# Patient Record
Sex: Female | Born: 1960
Health system: Southern US, Community
[De-identification: ages and names within clinical notes are randomized; demographics above are authoritative.]

## PROBLEM LIST (undated history)

## (undated) DIAGNOSIS — R51 Headache: Secondary | ICD-10-CM

## (undated) DIAGNOSIS — J189 Pneumonia, unspecified organism: Secondary | ICD-10-CM

## (undated) DIAGNOSIS — Z87442 Personal history of urinary calculi: Secondary | ICD-10-CM

## (undated) DIAGNOSIS — Z9889 Other specified postprocedural states: Secondary | ICD-10-CM

## (undated) DIAGNOSIS — F329 Major depressive disorder, single episode, unspecified: Secondary | ICD-10-CM

## (undated) DIAGNOSIS — R112 Nausea with vomiting, unspecified: Secondary | ICD-10-CM

## (undated) DIAGNOSIS — M1990 Unspecified osteoarthritis, unspecified site: Secondary | ICD-10-CM

## (undated) DIAGNOSIS — G709 Myoneural disorder, unspecified: Secondary | ICD-10-CM

## (undated) DIAGNOSIS — E611 Iron deficiency: Secondary | ICD-10-CM

## (undated) DIAGNOSIS — L900 Lichen sclerosus et atrophicus: Secondary | ICD-10-CM

## (undated) DIAGNOSIS — F419 Anxiety disorder, unspecified: Secondary | ICD-10-CM

## (undated) DIAGNOSIS — F32A Depression, unspecified: Secondary | ICD-10-CM

## (undated) DIAGNOSIS — R519 Headache, unspecified: Secondary | ICD-10-CM

## (undated) HISTORY — PX: BREAST SURGERY: SHX581

## (undated) HISTORY — PX: SHOULDER SURGERY: SHX246

## (undated) HISTORY — DX: Lichen sclerosus et atrophicus: L90.0

---

## 1986-09-20 HISTORY — PX: OTHER SURGICAL HISTORY: SHX169

## 1997-09-20 HISTORY — PX: TOTAL ABDOMINAL HYSTERECTOMY: SHX209

## 1999-05-12 ENCOUNTER — Ambulatory Visit (HOSPITAL_COMMUNITY): Admission: RE | Admit: 1999-05-12 | Discharge: 1999-05-12 | Payer: Self-pay | Admitting: Otolaryngology

## 1999-05-12 ENCOUNTER — Encounter: Payer: Self-pay | Admitting: Otolaryngology

## 1999-07-10 ENCOUNTER — Other Ambulatory Visit: Admission: RE | Admit: 1999-07-10 | Discharge: 1999-07-10 | Payer: Self-pay | Admitting: Obstetrics and Gynecology

## 1999-09-02 ENCOUNTER — Encounter (INDEPENDENT_AMBULATORY_CARE_PROVIDER_SITE_OTHER): Payer: Self-pay | Admitting: Specialist

## 1999-09-02 ENCOUNTER — Other Ambulatory Visit: Admission: RE | Admit: 1999-09-02 | Discharge: 1999-09-02 | Payer: Self-pay | Admitting: Obstetrics and Gynecology

## 2000-05-26 ENCOUNTER — Encounter (INDEPENDENT_AMBULATORY_CARE_PROVIDER_SITE_OTHER): Payer: Self-pay | Admitting: Specialist

## 2000-05-26 ENCOUNTER — Inpatient Hospital Stay (HOSPITAL_COMMUNITY): Admission: RE | Admit: 2000-05-26 | Discharge: 2000-05-28 | Payer: Self-pay | Admitting: Obstetrics and Gynecology

## 2001-06-22 ENCOUNTER — Other Ambulatory Visit: Admission: RE | Admit: 2001-06-22 | Discharge: 2001-06-22 | Payer: Self-pay | Admitting: Obstetrics and Gynecology

## 2001-08-16 ENCOUNTER — Ambulatory Visit (HOSPITAL_BASED_OUTPATIENT_CLINIC_OR_DEPARTMENT_OTHER): Admission: RE | Admit: 2001-08-16 | Discharge: 2001-08-16 | Payer: Self-pay | Admitting: Orthopedic Surgery

## 2001-10-31 ENCOUNTER — Encounter: Payer: Self-pay | Admitting: General Surgery

## 2001-10-31 ENCOUNTER — Encounter: Admission: RE | Admit: 2001-10-31 | Discharge: 2001-10-31 | Payer: Self-pay | Admitting: General Surgery

## 2001-12-27 ENCOUNTER — Ambulatory Visit (HOSPITAL_COMMUNITY): Admission: RE | Admit: 2001-12-27 | Discharge: 2001-12-27 | Payer: Self-pay | Admitting: Orthopedic Surgery

## 2001-12-27 ENCOUNTER — Encounter: Payer: Self-pay | Admitting: Orthopedic Surgery

## 2002-08-14 ENCOUNTER — Ambulatory Visit (HOSPITAL_BASED_OUTPATIENT_CLINIC_OR_DEPARTMENT_OTHER): Admission: RE | Admit: 2002-08-14 | Discharge: 2002-08-14 | Payer: Self-pay | Admitting: Orthopedic Surgery

## 2002-08-29 ENCOUNTER — Other Ambulatory Visit: Admission: RE | Admit: 2002-08-29 | Discharge: 2002-08-29 | Payer: Self-pay | Admitting: Obstetrics and Gynecology

## 2002-10-17 ENCOUNTER — Encounter: Payer: Self-pay | Admitting: Obstetrics and Gynecology

## 2002-10-17 ENCOUNTER — Encounter: Admission: RE | Admit: 2002-10-17 | Discharge: 2002-10-17 | Payer: Self-pay | Admitting: Obstetrics and Gynecology

## 2002-11-21 ENCOUNTER — Encounter: Payer: Self-pay | Admitting: Family Medicine

## 2002-11-21 ENCOUNTER — Encounter: Admission: RE | Admit: 2002-11-21 | Discharge: 2002-11-21 | Payer: Self-pay | Admitting: Family Medicine

## 2003-02-10 ENCOUNTER — Emergency Department (HOSPITAL_COMMUNITY): Admission: EM | Admit: 2003-02-10 | Discharge: 2003-02-10 | Payer: Self-pay | Admitting: Emergency Medicine

## 2003-04-21 ENCOUNTER — Encounter: Payer: Self-pay | Admitting: Emergency Medicine

## 2003-04-21 ENCOUNTER — Emergency Department (HOSPITAL_COMMUNITY): Admission: EM | Admit: 2003-04-21 | Discharge: 2003-04-21 | Payer: Self-pay | Admitting: Emergency Medicine

## 2003-10-24 ENCOUNTER — Encounter: Admission: RE | Admit: 2003-10-24 | Discharge: 2003-10-24 | Payer: Self-pay | Admitting: Obstetrics and Gynecology

## 2003-10-30 ENCOUNTER — Other Ambulatory Visit: Admission: RE | Admit: 2003-10-30 | Discharge: 2003-10-30 | Payer: Self-pay | Admitting: Obstetrics and Gynecology

## 2004-03-19 ENCOUNTER — Ambulatory Visit (HOSPITAL_COMMUNITY): Admission: RE | Admit: 2004-03-19 | Discharge: 2004-03-19 | Payer: Self-pay | Admitting: Orthopedic Surgery

## 2005-02-18 ENCOUNTER — Encounter: Admission: RE | Admit: 2005-02-18 | Discharge: 2005-02-18 | Payer: Self-pay | Admitting: Obstetrics and Gynecology

## 2005-06-15 ENCOUNTER — Other Ambulatory Visit: Admission: RE | Admit: 2005-06-15 | Discharge: 2005-06-15 | Payer: Self-pay | Admitting: Obstetrics and Gynecology

## 2005-09-10 ENCOUNTER — Ambulatory Visit (HOSPITAL_COMMUNITY): Admission: RE | Admit: 2005-09-10 | Discharge: 2005-09-10 | Payer: Self-pay | Admitting: Orthopedic Surgery

## 2005-09-10 ENCOUNTER — Ambulatory Visit (HOSPITAL_BASED_OUTPATIENT_CLINIC_OR_DEPARTMENT_OTHER): Admission: RE | Admit: 2005-09-10 | Discharge: 2005-09-10 | Payer: Self-pay | Admitting: Orthopedic Surgery

## 2005-12-22 ENCOUNTER — Encounter: Admission: RE | Admit: 2005-12-22 | Discharge: 2005-12-22 | Payer: Self-pay | Admitting: Obstetrics and Gynecology

## 2006-03-25 ENCOUNTER — Ambulatory Visit (HOSPITAL_COMMUNITY): Admission: RE | Admit: 2006-03-25 | Discharge: 2006-03-25 | Payer: Self-pay | Admitting: Oral Surgery

## 2006-12-27 ENCOUNTER — Encounter: Admission: RE | Admit: 2006-12-27 | Discharge: 2006-12-27 | Payer: Self-pay | Admitting: Obstetrics and Gynecology

## 2007-01-08 ENCOUNTER — Emergency Department (HOSPITAL_COMMUNITY): Admission: EM | Admit: 2007-01-08 | Discharge: 2007-01-08 | Payer: Self-pay | Admitting: Emergency Medicine

## 2007-03-22 ENCOUNTER — Ambulatory Visit (HOSPITAL_COMMUNITY): Admission: RE | Admit: 2007-03-22 | Discharge: 2007-03-22 | Payer: Self-pay | Admitting: Orthopedic Surgery

## 2007-06-20 ENCOUNTER — Encounter: Admission: RE | Admit: 2007-06-20 | Discharge: 2007-06-20 | Payer: Self-pay | Admitting: Obstetrics and Gynecology

## 2008-08-19 ENCOUNTER — Emergency Department (HOSPITAL_COMMUNITY): Admission: EM | Admit: 2008-08-19 | Discharge: 2008-08-19 | Payer: Self-pay | Admitting: Emergency Medicine

## 2008-09-25 ENCOUNTER — Ambulatory Visit (HOSPITAL_COMMUNITY): Admission: RE | Admit: 2008-09-25 | Discharge: 2008-09-25 | Payer: Self-pay | Admitting: Orthopedic Surgery

## 2009-01-21 ENCOUNTER — Encounter: Admission: RE | Admit: 2009-01-21 | Discharge: 2009-01-21 | Payer: Self-pay | Admitting: General Surgery

## 2009-02-20 ENCOUNTER — Encounter: Admission: RE | Admit: 2009-02-20 | Discharge: 2009-02-20 | Payer: Self-pay | Admitting: Obstetrics and Gynecology

## 2010-01-22 ENCOUNTER — Emergency Department (HOSPITAL_COMMUNITY): Admission: EM | Admit: 2010-01-22 | Discharge: 2010-01-22 | Payer: Self-pay | Admitting: Emergency Medicine

## 2010-01-23 ENCOUNTER — Emergency Department (HOSPITAL_COMMUNITY): Admission: EM | Admit: 2010-01-23 | Discharge: 2010-01-23 | Payer: Self-pay | Admitting: Emergency Medicine

## 2010-05-21 ENCOUNTER — Ambulatory Visit (HOSPITAL_COMMUNITY): Admission: RE | Admit: 2010-05-21 | Discharge: 2010-05-21 | Payer: Self-pay | Admitting: Orthopedic Surgery

## 2010-08-05 ENCOUNTER — Ambulatory Visit (HOSPITAL_BASED_OUTPATIENT_CLINIC_OR_DEPARTMENT_OTHER): Admission: RE | Admit: 2010-08-05 | Discharge: 2010-08-05 | Payer: Self-pay | Admitting: Orthopedic Surgery

## 2010-09-20 HISTORY — PX: AUGMENTATION MAMMAPLASTY: SUR837

## 2010-10-11 ENCOUNTER — Encounter: Payer: Self-pay | Admitting: Obstetrics and Gynecology

## 2010-10-11 ENCOUNTER — Encounter: Payer: Self-pay | Admitting: Orthopedic Surgery

## 2010-11-11 ENCOUNTER — Ambulatory Visit
Payer: PRIVATE HEALTH INSURANCE | Attending: Orthopedic Surgery | Admitting: Rehabilitative and Restorative Service Providers"

## 2010-11-11 DIAGNOSIS — M25519 Pain in unspecified shoulder: Secondary | ICD-10-CM | POA: Insufficient documentation

## 2010-11-11 DIAGNOSIS — IMO0001 Reserved for inherently not codable concepts without codable children: Secondary | ICD-10-CM | POA: Insufficient documentation

## 2010-11-18 ENCOUNTER — Ambulatory Visit: Payer: PRIVATE HEALTH INSURANCE | Admitting: Rehabilitative and Restorative Service Providers"

## 2010-11-23 ENCOUNTER — Ambulatory Visit (INDEPENDENT_AMBULATORY_CARE_PROVIDER_SITE_OTHER): Payer: Self-pay | Admitting: Emergency Medicine

## 2010-11-23 ENCOUNTER — Other Ambulatory Visit: Payer: Self-pay | Admitting: Emergency Medicine

## 2010-11-23 ENCOUNTER — Encounter: Payer: Self-pay | Admitting: Emergency Medicine

## 2010-11-23 ENCOUNTER — Ambulatory Visit
Admission: RE | Admit: 2010-11-23 | Discharge: 2010-11-23 | Disposition: A | Discharge status: Home / Self Care | Payer: Self-pay | Source: Ambulatory Visit | Attending: Emergency Medicine | Admitting: Emergency Medicine

## 2010-11-23 DIAGNOSIS — J069 Acute upper respiratory infection, unspecified: Secondary | ICD-10-CM

## 2010-11-23 DIAGNOSIS — R05 Cough: Secondary | ICD-10-CM

## 2010-11-23 DIAGNOSIS — R059 Cough, unspecified: Secondary | ICD-10-CM

## 2010-12-01 LAB — POCT HEMOGLOBIN-HEMACUE: Hemoglobin: 11.9 g/dL — ABNORMAL LOW (ref 12.0–15.0)

## 2010-12-01 NOTE — Assessment & Plan Note (Signed)
Summary: COUGH/FEVER Room 5   Vital Signs:  Patient Profile:   50 Years Old Female CC:      Cough, fever 101-102 x 3 days Height:     61 inches Weight:      120 pounds O2 Sat:      99 % O2 treatment:    Room Air Temp:     98.5 degrees F oral Pulse rate:   90 / minute Pulse rhythm:   regular Resp:     16 per minute BP sitting:   133 / 82  (left arm) Cuff size:   regular  Vitals Entered By: Emilio Math (November 23, 2010 8:31 AM)                  Current Allergies: ! PENICILLIN ! * EMYCINHistory of Present Illness History from: patient Chief Complaint: Cough, fever 101-102 x 3 days History of Present Illness: 50 Years Old Female complains of onset of cold symptoms for 3-4 days.  [facility] has been using Delsym which isn't helping.  She had double PNA in the past and is concerned about that. No sore throat + cough No pleuritic pain No wheezing + nasal congestion + post-nasal drainage + sinus pain/pressure + chest congestion No itchy/red eyes No earache No hemoptysis No SOB No chills/sweats No fever No nausea No vomiting No abdominal pain No diarrhea No skin rashes + fatigue + myalgias No headache   Current Meds TYLENOL 325 MG TABS (ACETAMINOPHEN) 2 prn MOTRIN IB 200 MG TABS (IBUPROFEN) 3 prn CIPROFLOXACIN HCL 500 MG TABS (CIPROFLOXACIN HCL) 1 by mouth two times a day for 7 days TUSSIONEX PENNKINETIC ER 10-8 MG/5ML LQCR (HYDROCOD POLST-CHLORPHEN POLST) 5cc q6 hrs prn  REVIEW OF SYSTEMS Constitutional Symptoms       Complains of fever and chills.     Denies night sweats, weight loss, weight gain, and fatigue.  Eyes       Denies change in vision, eye pain, eye discharge, glasses, contact lenses, and eye surgery. Ear/Nose/Throat/Mouth       Complains of sinus problems and sore throat.      Denies hearing loss/aids, change in hearing, ear pain, ear discharge, dizziness, frequent runny nose, frequent nose bleeds, hoarseness, and tooth pain or bleeding.    Respiratory       Complains of dry cough.      Denies productive cough, wheezing, shortness of breath, asthma, bronchitis, and emphysema/COPD.  Cardiovascular       Denies murmurs, chest pain, and tires easily with exhertion.    Gastrointestinal       Denies stomach pain, nausea/vomiting, diarrhea, constipation, blood in bowel movements, and indigestion. Genitourniary       Denies painful urination, kidney stones, and loss of urinary control. Neurological       Complains of headaches.      Denies paralysis, seizures, and fainting/blackouts. Musculoskeletal       Denies muscle pain, joint pain, joint stiffness, decreased range of motion, redness, swelling, muscle weakness, and gout.  Skin       Denies bruising, unusual mles/lumps or sores, and hair/skin or nail changes.  Psych       Denies mood changes, temper/anger issues, anxiety/stress, speech problems, depression, and sleep problems.  Past History:  Past Medical History: Unremarkable  Past Surgical History: Bi lateral shoulder Breast surgery  Family History: Mother, Unk Father, Heart condition, pace maker  Social History: Non smoker ETOH-yes No [employer]ugs OR [employer] Physical Exam General appearance: well  developed, well nourished, no acute distress Ears: normal, no lesions or deformities Nasal: mucosa pink, nonedematous, no septal deviation, turbinates normal Oral/Pharynx: tongue normal, posterior pharynx without erythema or exudate Chest/Lungs: no rales, wheezes, or rhonchi bilateral, breath sounds equal without effort Heart: regular rate and  rhythm, no murmur MSE: oriented to time, place, and person Assessment New Problems: UPPER RESPIRATORY INFECTION, ACUTE (ICD-465.9) COUGH (ICD-786.2)   Plan New Medications/Changes: Sandria Senter ER 10-8 MG/5ML LQCR (HYDROCOD POLST-CHLORPHEN POLST) 5cc q6 hrs prn  #5oz x 0, 11/23/2010, Hoyt Koch MD CIPROFLOXACIN HCL 500 MG TABS (CIPROFLOXACIN HCL) 1 by mouth two  times a day for 7 days  #14 x 0, 11/23/2010, Hoyt Koch MD  New Orders: New Patient Level III [id number] T-Chest x-ray, 2 views [71020] Pulse Oximetry (single measurment) [94760] Planning Comments:   CXR is normal 1)  Take the prescribed antibiotic as instructed. 2)  Use nasal saline solution (over the counter) at least 3 times a day. 3)  Use over the counter decongestants like Zyrtec-D every 12 hours as needed to help with congestion. 4)  Can take tylenol every 6 hours or motrin every 8 hours for pain or fever. 5)  Follow up with your primary doctor  if no improvement in 5-7 days, sooner if increasing pain, fever, or new symptoms.    The patient and/or caregiver has been counseled thoroughly with regard to medications prescribed including dosage, schedule, interactions, rationale for use, and possible side effects and they verbalize understanding.  Diagnoses and expected course of recovery discussed and will return if not improved as expected or if the condition worsens. Patient and/or caregiver verbalized understanding.  Prescriptions: Sandria Senter ER 10-8 MG/5ML LQCR (HYDROCOD POLST-CHLORPHEN POLST) 5cc q6 hrs prn  #5oz x 0   Entered and Authorized by:   Hoyt Koch MD   Signed by:   Hoyt Koch MD on 11/23/2010   Method used:   Print then Give to Patient   RxID:   [id number] CIPROFLOXACIN HCL 500 MG TABS (CIPROFLOXACIN HCL) 1 by mouth two times a day for 7 days  #14 x 0   Entered and Authorized by:   Hoyt Koch MD   Signed by:   Hoyt Koch MD on 11/23/2010   Method used:   Print then Give to Patient   RxID:   [id number]   Orders Added: 1)  New Patient Level III [34742] 2)  T-Chest x-ray, 2 views [71020] 3)  Pulse Oximetry (single measurment) [59563]

## 2010-12-01 NOTE — Letter (Signed)
Summary: Out of Work  [facility]  [address]   [place], [place] [address]   Phone: [phone]  Fax: [phone]    November 23, 2010   Employee:  [employer]LLANY [employer] [facility]    To Whom It May Concern:   For Medical reasons, please excuse the above named employee from work for the following dates:  March 5-6, 2012           Sincerely,    Hoyt Koch MD

## 2010-12-03 ENCOUNTER — Encounter: Payer: Self-pay | Admitting: Family Medicine

## 2010-12-08 LAB — CBC
MCHC: 33.7 g/dL (ref 30.0–36.0)
Platelets: 254 10*3/uL (ref 150–400)
RDW: 12.8 % (ref 11.5–15.5)

## 2010-12-08 LAB — DIFFERENTIAL
Basophils Absolute: 0 10*3/uL (ref 0.0–0.1)
Basophils Relative: 1 % (ref 0–1)
Lymphocytes Relative: 46 % (ref 12–46)
Neutro Abs: 3.1 10*3/uL (ref 1.7–7.7)
Neutrophils Relative %: 45 % (ref 43–77)

## 2010-12-08 LAB — URINE MICROSCOPIC-ADD ON

## 2010-12-08 LAB — POCT I-STAT, CHEM 8
Calcium, Ion: 1.17 mmol/L (ref 1.12–1.32)
Chloride: 103 mEq/L (ref 96–112)
HCT: 38 % (ref 36.0–46.0)
Potassium: 3.4 mEq/L — ABNORMAL LOW (ref 3.5–5.1)
Sodium: 140 mEq/L (ref 135–145)

## 2010-12-08 LAB — URINALYSIS, ROUTINE W REFLEX MICROSCOPIC
Ketones, ur: NEGATIVE mg/dL
Protein, ur: NEGATIVE mg/dL
Urobilinogen, UA: 1 mg/dL (ref 0.0–1.0)

## 2010-12-08 NOTE — Assessment & Plan Note (Signed)
Summary: Followup Call: Persistent Cough  Patient calls c/o a persistant dry cough since her visit 2 weeks ago. 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 She has run out of her Rx for Tussionex. She would like something for her cough during the day and for night time.    Plan:  BENZONATATE 200 MG CAPS (BENZONATATE) One by mouth two times a day to three times a day as needed cough Refill Tussionex for bedtime Donna Christen MD  December 03, 2010 10:20 AM   Prescriptions: Sandria Senter ER 10-8 MG/5ML LQCR (HYDROCOD POLST-CHLORPHEN POLST) 5cc PO hs as needed cough  #2oz x 0   Entered and Authorized by:   Donna Christen MD   Signed by:   Donna Christen MD on 12/03/2010   Method used:   Printed then faxed to ...       [facility]* (retail)       [address]       [address]/mail[address]       [place], [place]  [address]       Ph: [phone]       Fax: [phone]   RxID:   [id number] BENZONATATE 200 MG CAPS (BENZONATATE) One by mouth two times a day to three times a day as needed cough  #20 x 0   Entered and Authorized by:   Donna Christen MD   Signed by:   Donna Christen MD on 12/03/2010   Method used:   Electronically to        [facility]* (retail)       [address]       [address] Shipping/mailing       [place], [place]  [address]       Ph: [phone]       Fax: [phone]   RxID:   [id number]   Appended Document: Followup Call: Persistent Cough I informed the patient about the Rx we sent to the pharmacy. She said in the past she has this happen and received a Rx for an inhaler to help with catchingher breath between coughing spells. She wants one sent over if you think this could help her. Thanks!

## 2011-01-27 ENCOUNTER — Inpatient Hospital Stay (INDEPENDENT_AMBULATORY_CARE_PROVIDER_SITE_OTHER)
Admission: RE | Admit: 2011-01-27 | Discharge: 2011-01-27 | Disposition: A | Discharge status: Home / Self Care | Payer: 59 | Source: Ambulatory Visit | Attending: Family Medicine | Admitting: Family Medicine

## 2011-01-27 DIAGNOSIS — T280XXA Burn of mouth and pharynx, initial encounter: Secondary | ICD-10-CM

## 2011-02-05 NOTE — Discharge Summary (Signed)
[facility]  Patient:    Dana Sanders, Dana Sanders [facility]                MRN: [id number] Adm. Date:  [id number] Disc. Date: [id number] Attending:  Cordelia Pen Ii                           Discharge Summary  ADMISSION DIAGNOSIS:  Menometrorrhagia with cystocele and rectocele.  DISCHARGE DIAGNOSIS:  Menometrorrhagia with cystocele and rectocele.  PROCEDURES:  On May 26, 2000, total vaginal hysterectomy with anterior and posterior vaginal repair.  HISTORY OF PRESENT ILLNESS:  This patient is a 50 year old married white female, G2, P2, status post tubal ligation, with increasing symptoms of menometrorrhagia and rectocele and cystocele.  Details are dictated in the history and physical.  The patient is admitted for surgical treatment.  HOSPITAL COURSE:  The patient is admitted to the hospital, undergoes the above procedure without complications.  Estimated blood loss is 200 cc.  On the evening of surgery she has good pain relief, stable vital signs, is afebrile, with good urine output which is clear.  On the first postoperative day she has mild nausea but no vomiting.  She is tolerating liquids but not yet passing flatus.  She remains afebrile.  CBC returns with white count of 5.8, hemoglobin 10.4, platelet count 196,000.  Vaginal pack is removed on the first postoperative day, and her bladder trials are started.  Duricef orally is resumed with the patient being status post breast implant surgery.  On the day of discharge the patient is without complaint.  She is stable, with stable vital signs and afebrile.  She is voiding less than 100 cc with residuals of 30-250 cc.  She is discharged home.  Instructions are given.  She is given Vicoprofen for pain p.r.n.  CONDITION ON DISCHARGE:  Good.  DIET:  Regular as tolerated.  ACTIVITY:  No lifting, no operation of automobiles, no vaginal entry.  DISCHARGE INSTRUCTIONS:  The patient is to call the office  for problems including but not limited to temperature greater than 100 degrees, persistent nausea or vomiting, increasing pain, or heavy vaginal bleeding.  Instructions are given for catheter care.  FOLLOW-UP:  In the office in one to two weeks.  MEDICATIONS: 1. Duricef 1 p.o. b.i.d. 2. Vicoprofen 1-2 p.o. q.6h. p.r.n. pain. DD:  06/20/00 TD:  06/20/00 Job: [id number]

## 2011-02-05 NOTE — Op Note (Signed)
[facility]  Patient:    Dana Sanders, Dana Sanders Visit Number: [id number] MRN: [id number]          Service Type: DSU Location: [facility] Attending Physician:  Alinda Deem Dictated by:   Alinda Deem, M.D. Proc. Date: 08/16/01 Admit Date:  08/16/2001                             Operative Report  PREOPERATIVE DIAGNOSES: 1. Left shoulder acromioclavicular arthritis. 2. Impingement syndrome.  POSTOPERATIVE DIAGNOSES: 1. Left shoulder acromioclavicular arthritis. 2. Impingement syndrome. 3. Degenerative superior anterior labral tear. 4. Partial-thickness internal leaflet rotator cuff tear.  PROCEDURES: 1. Left shoulder arthroscopic anterior inferior acromioplasty. 2. Distal clavicle excision. 3. Debridement of labral tear and internal leaflet rotator cuff tear    involving about 10-20% of the rotator cuff.  SURGEON:  Alinda Deem, M.D.  FIRST ASSISTANT:  Dorthula Matas, P.A.-C.  ANESTHESIA:  General endotracheal.  ESTIMATED BLOOD LOSS:  Minimal.  FLUID REPLACEMENT:  1 L crystalloid.  DRAINS:  None.  TOURNIQUET TIME:  None.  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  She is a Facilities manager, weight trainer, Airline pilot, and has developed left shoulder AC joint arthritis secondary to her conditioning program.  She has been treated appropriately and conservatively with anti-inflammatory medicines, got good temporary relief from cortisone injections, and now desires arthroscopic decompression of her left shoulder.  DESCRIPTION OF PROCEDURE:  The patient identified by arm band, taken to the operating room at [facility] day surgery center.  Appropriate anesthetic monitors were attached and general endotracheal anesthesia induced with the patient in supine position.  She was  then placed in the beach chair position and the left upper extremity prepped and draped in the usual sterile fashion from the wrist to the hemithorax.  The skin along the anterior, lateral, and posterior aspects of the acromion process was infiltrated with 2-3 cc of 0.5% Marcaine and epinephrine solution and using a #11 blade, standard portals were made 1.5 cm anterior to the [facility] joint, lateral to the junction of the middle and posterior thirds of the acromion and posterior to the posterolateral corner of the acromion process.  The inflow was placed anteriorly, the arthroscope laterally, and a 4.2 mm [place] sucker shaver posteriorly, allowing removal of somewhat exuberant subacromial bursa.  This outlined a subacromial spur and the bare bone of the distal clavicle.  Using a 4.5 mm hooded Vortex bur, we then removed the subacromial spur without difficulty and set about removing the distal one-half inch of the collar bone.  Most of this was accomplished bringing the bur in from posteriorly; however, to get the superior aspect of the distal clavicle removed, we went ahead and placed the scope posteriorly, the inflow laterally, and the bur anteriorly, completing the distal clavicle excision, and photographic documentation was made of the same.  The external rotator cuff appeared to be in good condition.  The arthroscope was then repositioned into the glenohumeral joint using the posterior portal, where I identified degenerative tearing of the superior anterior labrum, which was debrided, as well as a partial-thickness internal leaflet rotator cuff tear at the supraspinatus insertion, which was likewise debrided.  It involved maybe 5 or 10% of the supraspinatus insertion.  The glenohumeral joint articular cartilage was  in excellent condition, as was the biceps anchor and the biceps tendon.  The shoulder was then washed out with normal saline solution, the arthroscopic instruments removed, a  dressing of Xeroform, 4 x 4 dressing sponges, and paper tape applied.  The patient awakened and taken to the recovery room without difficulty. Dictated by:   Alinda Deem, M.D. Attending Physician:  Alinda Deem DD:  08/16/01 TD:  08/16/01 Job: [id number]

## 2011-02-05 NOTE — Op Note (Signed)
[facility]  Patient:    Dana Sanders, Dana Sanders [facility]                MRN: [id number] Proc. Date: 05/26/00 Adm. Date:  [id number] Attending:  Cordelia Pen Ii                           Operative Report  PREOPERATIVE DIAGNOSES: 1. Menometrorrhagia. 2. Cystocele and rectocele.  POSTOPERATIVE DIAGNOSES: 1. Menometrorrhagia. 2. Cystocele and rectocele.  PROCEDURE:  Total vaginal hysterectomy with anterior and posterior vaginal repair.  SURGEON:  Guy Sandifer. Arleta Creek, M.D.  ANESTHESIA:  Beather Arbour. Thomasena Edis, M.D.  ANESTHESIA:  General endotracheal with intubation.  ESTIMATED BLOOD LOSS:  200 cc.  INDICATIONS AND CONSENT:  The patient is a 50 year old married white female, gravida 2, para 2, status post tubal ligation with increasing menometrorrhagia, and cystocele and rectocele.  Details are dictated in the History and Physical.  Total vaginal hysterectomy is discussed with the patient.  Anterior and posterior repair was also discussed.  Removal of an ovary only if distinctly abnormal with the event of complication is discussed. The possible risks and complications are discussed including, but not limited to infection, bleeding requiring transfusion or blood products with possible ________, HIV, hepatitis _______, DVT, PE, pneumonia, laparotomy, and fistula formation.  All questions were answered and consent is signed on the chart.  DESCRIPTION OF PROCEDURE:  The patient was taken to the operating room and placed in the dorsolithotomy position where general anesthesia was induced via endotracheal intubation.  She was then placed in the dorsolithotomy position where she was prepped, bladder straight catheterized, and she was draped in a sterile fashion.  A weighted speculum was placed and the posterior cul-de-sac was then entered sharply without difficulty.  The cervix was circumscribed with the scalpel and the mucosa was advanced bluntly and sharply.   The uterosacral ligaments were taken down bilaterally and ligated with transfixion sutures of 0 Monocryl.  All sutures of these were Monocryl unless otherwise designated.  Bladder pillars were then taken bilaterally.  The anterior cul-de-sac was entered without difficulty.  The cardinal ligaments are taken down followed by the uterine vessels bilaterally which are singly ligated. Additional bite above the level of the uterine vessels were taken bilaterally. The ________ was then delivered posteriorly.  The proximal ligaments were clamped and cut and the specimen was delivered.  The pedicles are then doubly ligated, first with a suture and then with a free tie.  Careful inspection reveals excellent hemostasis.  The posterior vaginal cuff was then run in a locking fashion with a 0 Monocryl suture to obtain hemostasis.  The uterosacral ligaments were then plicated to the vagina bilaterally.  The uterosacral ligaments were plicated in the midline with two separate sutures.  The cuff was then closed posteriorly with figure-of-eight sutures.  Anterior repair was then carried out by dissecting the anterior vaginal mucosa from the underlying bladder.  Two Kelly plication sutures are placed.  The bladder base is then elevated with the pursestring suture.  The vesicovaginal fascia was then reapproximated with interrupted sutures.  Excess mucosa is trimmed.  The anterior half of the vaginal cuff was then closed with interrupted figure-of-eights.  The anterior vaginal mucosa was then run in a locking fashion with a 2-0 Monocryl suture.  The posterior vaginal repair was then carried out by first removing a diamond-shaped wedge of tissue from the posterior peritoneal body.  The posterior vaginal mucosa  is then dissected from the underlying rectum in the midline.  This was then carried out bilaterally sharply and bluntly.  The rectovaginal fascia was then reapproximated in the midline with interrupted  sutures.  Excess mucosa was trimmed and the posterior vaginal mucosa was then closed with in a running locking fashion with 2-0 Vicryl suture.  The posterior peritoneal body was then dissected out and reapproximated with interrupted 0 Monocryl sutures. The remainder of the incision was closed in a standard episiotomy-type fashion using the 2-0 Monocryl suture.  A Foley catheter was then placed in the bladder and approximately 200 cc of clear urine are obtained.  The bladder was then filled retrograde with approximately 400 cc of normal saline.  A suprapubic Bonnano catheter was then placed on the first attempt.  Positive aspiration of saline is noted with and without the stylet in place.  The bladder was drained while the Bonnano catheter was sewn in place with permanent suture.  The Foley catheter was then removed and a 1 inch Iodoform gauze was then placed in the vagina as a pack.  All counts were correct.  The patient was awakened and taken to the recovery room in stable condition. DD:  05/26/00 TD:  05/27/00 Job: [id number]

## 2011-02-05 NOTE — H&P (Signed)
[facility]  Patient:    Dana Sanders, Dana Sanders                    MRN: [id number] Adm. Date:  05/26/00 Attending:  Guy Sandifer. Arleta Creek, M.D.                         History and Physical  CHIEF COMPLAINT:  Menometrorrhagia and cystocele.  HISTORY OF PRESENT ILLNESS:  This patient is a 50 year old, married, white female, G2, P2, status post tubal ligation, status post laparoscopy with lysis of adhesions and diagnostic hysteroscopy and D&C in December of 2000.  She continues to have continued menorrhagia and dysmenorrhea.  She is having menses essentially every two weeks, bleeding seven days at a time with three to four days being heavy, changing a pad and a tampon every 15-30 minutes. 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  Also on examination, she has a cystocele presenting at the vaginal introitus.  She has some mild stress incontinence at times.  After careful discussion of the options, she is scheduled for a total vaginal hysterectomy with anterior and posterior vaginal repair.  PAST MEDICAL HISTORY:  1. Superficial varicosities, lower extremities. 2. Migraine headaches.  3. Pneumonia at age 53.  PAST SURGICAL HISTORY:  1. Cystoscopy.  2. Rhinoplasty and revision. 3. Breast augmentation.  4. Foot surgery.  5. Benign breast biopsy. 6. Laparoscopy with tubal ligation.  7. Laparoscopy, hysterectomy, and D&C.  MEDICATIONS: 1. Xanax 1 mg p.r.n. 2. Iron daily.  ALLERGIES:  PENICILLIN, ERYTHROMYCIN, and CODEINE.  SOCIAL HISTORY:  Alcohol on a social basis.  Denies tobacco or drug abuse.  FAMILY HISTORY:  Positive for diabetes in mother, leukemia in father, chronic hypertension in mother, coronary artery disease in maternal grandfather, kidney stones in a brother, Parkinsons syndrome in a maternal uncle, and rheumatoid arthritis in a sister.  OBSTETRICAL HISTORY:  Vaginal  delivery x 2.  REVIEW OF SYSTEMS:  Negative, except as above.  PHYSICAL EXAMINATION:  Weight 125 pounds.  VITAL SIGNS:  Blood pressure 120/90.  HEENT:  Without thyromegaly.  LUNGS:  Clear to auscultation.  HEART:  Regular rate and rhythm.  BACK:  Without CVA tenderness.  BREASTS:  Without mass, retraction, or discharge.  Status post augmentation bilaterally.  ABDOMEN:  Soft and nontender without masses.  PELVIC:  Vulva, vagina, and cervix without lesion.  A cystocele and urethrocele are at the vaginal introitus with mild Valsalva maneuver in the dorsolithotomy position.  Small rectocele on rectovaginal exam.  The uterus is upper limits of normal size and mobile.  Adnexa with mild tenderness bilaterally without masses.  EXTREMITIES:  Exam grossly within normal limits.  NEUROLOGIC:  Exam grossly within normal limits.  ASSESSMENT: 1. Menometrorrhagia. 2. Cystocele. 3. Small rectocele.  PLAN:  Total abdominal hysterectomy and anterior and posterior vaginal repair. The patient is also scheduled to have carpal tunnel surgery with Elisha Ponder, M.D., following this.  DD:  05/19/00 TD:  05/19/00 Job: [id number]

## 2011-02-05 NOTE — Op Note (Signed)
   NAMETORRY, ADAMCZAK                     ACCOUNT NO.:  [id number]   MEDICAL RECORD NO.:  [id number]                   PATIENT TYPE:  AMB   LOCATION:  DSC                                  FACILITY:  [facility]   PHYSICIAN:  Cindee Salt, M.D.                    DATE OF BIRTH:  10-30-1960   DATE OF PROCEDURE:  08/14/2002  DATE OF DISCHARGE:                                 OPERATIVE REPORT   PREOPERATIVE DIAGNOSIS:  Laceration extensor pollicis longus tendon, right  thumb.   POSTOPERATIVE DIAGNOSIS:  Laceration extensor pollicis longus tendon, right  thumb.   OPERATION:  Repair delayed extensor pollicis longus tendon, right thumb.   SURGEON:  Nicki Reaper, M.D.   ASSISTANTCarolyne Fiscal, R.N.   ANESTHESIA:  IV regional.   HISTORY:  The patient is a 50 year old female who suffered a laceration to  the dorsal aspect of her right thumb.  She has had an inability to extend  the thumb since the laceration.  She is admitted now for repair.   DESCRIPTION OF PROCEDURE:  The patient was brought to the operating room  where a forearm-based IV regional anesthetic was carried out without  difficulty.  She was prepped and draped using Betadine scrub and solution,  right arm free in the supine position.  A curvilinear incision was made  incorporating the old laceration and carried down through subcutaneous  tissue.  The tendon was identified.  This was found to be widely separated  approximately 1 cm.  The scar, which was forming, was removed.  The  tenolysis performed.  This allowed the thumb end tendons to be extended and  the tendon ends to be brought together.  These were then repaired with  figure-of-eight 4-0 Mersilene sutures.  The wound was irrigated.  Skin was  closed with interrupted 5-0 nylon sutures.  A block was given to the dorsal  sensory nerves.  A sterile compressive dressing and splint with the wrist  and thumb adducted extended with the IP extended was applied.  The patient  tolerated the procedure well and was taken to the recovery room for  observation in satisfactory condition.  She is discharged home to return to  the hand center in [place] in one week on Vicodin and Septra DS.                                                 Cindee Salt, M.D.    GK/MEDQ  D:  08/14/2002  T:  08/14/2002  Job:  [id number]

## 2011-02-05 NOTE — Op Note (Signed)
NAMESIMON, AABERG           ACCOUNT NO.:  [id number]   MEDICAL RECORD NO.:  [id number]          PATIENT TYPE:  AMB   LOCATION:  DSC                          FACILITY:  [facility]   PHYSICIAN:  Feliberto Gottron. Turner Daniels, M.D.   DATE OF BIRTH:  [birth year]   DATE OF PROCEDURE:  09/10/2005  DATE OF DISCHARGE:                                 OPERATIVE REPORT   PREOPERATIVE DIAGNOSIS:  Right shoulder end-stage acromioclavicular joint  arthritis.   POSTOPERATIVE DIAGNOSIS:  Right shoulder end-stage acromioclavicular joint  arthritis, plus a small subacromial spur, degenerative tearing of the  posterosuperior and anterosuperior labrum as well as an internal leaflet  supraspinatus tear, about 5% of the tendon.   PROCEDURE:  Right shoulder arthroscopic anteroinferior acromioplasty; distal  clavicle excision, formal; debridement of labral tear and debridement of  internal leaflet, rotator cuff tear, partial-thickness.  The external  leaflet of the rotator cuff was  definitely intact.   SURGEON:  Feliberto Gottron. Turner Daniels, M.D.   FIRST ASSISTANT:  None.   ANESTHETIC:  General endotracheal.   ESTIMATED BLOOD LOSS:  Minimal.   FLUID REPLACEMENT:  8100 mL of crystalloid.   DRAINS PLACED:  None.   TOURNIQUET TIME:  None.   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  She no longer competes competitively in weight-training;  however, she does have end-stage arthritis by x-ray with severe pain that  wakes her up at night.  She desires elective arthroscopic evaluation and  treatment of her right shoulder including distal clavicle excision and we  will address any rotator cuff or labral pathology that we encounter as well.  The risks and  benefits of surgery were discussed prior to surgery, x-rays  taken, confirming end-stage arthritis of the [facility] joint with a subclavicular  spur as well, and she is prepared for surgical intervention.   DESCRIPTION OF PROCEDURE:  The patient was identified by armband and taken  to the operating room at [facility].  Appropriate anesthetic  monitors were attached and general endotracheal anesthesia induced with the  patient in the supine position.  She was then placed in the beach-chair  position and the right upper extremity prepped and draped in the usual  sterile fashion from the wrist to the hemithorax.  The skin along the  anterolateral and posterior aspects of the acromion process was infiltrated  with 2-3 mL of 0.5% percent Marcaine and epinephrine solution and using a  #11 blade, standard portals were made 1.5 cm anterior to the [facility] joint,  lateral to the junction of the middle and posterior thirds acromion, and  posterior the posterolateral corner acromion process.  The inflow was placed  anteriorly, the arthroscope laterally and a 4.2 great white sucker shaver  posteriorly.  We immediately identified the subclavicular spur and the  arthritic AC joint and outlined a small  residual spur off the anteroinferior  acromion.  Using a 4.5 hooded vortex bur, we then removed the distal  clavicle spur and the inferior 1/2 of the distal centimeter of clavicle,  also the subacromial spur.  We then visualized the rotator cuff and found it  to be intact externally, and directed our attention to removing the tip of  the collar bone  the scope was placed posteriorly, the inflow laterally and  a 4.5 hooded vortex bur anteriorly, allowing [place] to complete the distal  clavicle excision, and photographic documentation was made of the 1-cm gap.  At this point the arthroscopic instruments were removed, the inflow was  placed on the scope, cannula and the scope were inserted into the  glenohumeral  joint using the posterior portal.  We immediately identified  degenerative tearing of posterosuperior as well as superior anterior labrum  and this was debrided back to stable margin using a 3.5 Gator sucker shaver.  Internal leaflet tear of the supraspinatus was also noted and this was  debrided back to a stable margin, maybe 5% or 10% of the fibers and overall,  the tendon itself was intact.  The infraspinatus was definitely intact.  The  articular cartilage was in good condition.  The subscapularis had some  minimal fraying as well, but was lightly debrided.  At this point the  shoulder was irrigated out with normal saline solution and the arthroscopic  instruments removed.  A dressing of Xeroform, 4 x 4 dressing sponges, paper  tape and a sling applied.  The patient was laid supine, awakened and taken  to the recovery room without difficulty  She did receive 500 mg of  vancomycin intraoperatively as prophylaxis.      Feliberto Gottron. Turner Daniels, M.D.  Electronically Signed     FJR/MEDQ  D:  09/10/2005  T:  09/13/2005  Job:  [id number]

## 2011-03-01 ENCOUNTER — Other Ambulatory Visit: Payer: Self-pay | Admitting: Obstetrics and Gynecology

## 2011-03-01 DIAGNOSIS — Z1231 Encounter for screening mammogram for malignant neoplasm of breast: Secondary | ICD-10-CM

## 2011-03-08 ENCOUNTER — Ambulatory Visit
Admission: RE | Admit: 2011-03-08 | Discharge: 2011-03-08 | Disposition: A | Discharge status: Home / Self Care | Payer: Commercial Managed Care - PPO | Source: Ambulatory Visit | Attending: Obstetrics and Gynecology | Admitting: Obstetrics and Gynecology

## 2011-03-08 DIAGNOSIS — Z1231 Encounter for screening mammogram for malignant neoplasm of breast: Secondary | ICD-10-CM

## 2011-03-09 ENCOUNTER — Other Ambulatory Visit: Payer: Self-pay | Admitting: Obstetrics and Gynecology

## 2011-03-09 DIAGNOSIS — R928 Other abnormal and inconclusive findings on diagnostic imaging of breast: Secondary | ICD-10-CM

## 2011-03-12 ENCOUNTER — Ambulatory Visit
Admission: RE | Admit: 2011-03-12 | Discharge: 2011-03-12 | Disposition: A | Discharge status: Home / Self Care | Payer: Commercial Managed Care - PPO | Source: Ambulatory Visit | Attending: Obstetrics and Gynecology | Admitting: Obstetrics and Gynecology

## 2011-03-12 DIAGNOSIS — R928 Other abnormal and inconclusive findings on diagnostic imaging of breast: Secondary | ICD-10-CM

## 2011-03-22 ENCOUNTER — Encounter: Payer: Self-pay | Admitting: Pulmonary Disease

## 2011-03-23 ENCOUNTER — Encounter: Payer: Self-pay | Admitting: Pulmonary Disease

## 2011-03-23 ENCOUNTER — Ambulatory Visit (INDEPENDENT_AMBULATORY_CARE_PROVIDER_SITE_OTHER): Payer: 59 | Admitting: Pulmonary Disease

## 2011-03-23 VITALS — BP 104/76 | HR 72 | Temp 97.8°F | Ht 61.0 in | Wt 128.0 lb

## 2011-03-23 DIAGNOSIS — R05 Cough: Secondary | ICD-10-CM

## 2011-03-23 DIAGNOSIS — R059 Cough, unspecified: Secondary | ICD-10-CM

## 2011-03-23 DIAGNOSIS — R053 Chronic cough: Secondary | ICD-10-CM | POA: Insufficient documentation

## 2011-03-23 MED ORDER — HYDROCOD POLST-CHLORPHEN POLST 10-8 MG/5ML PO LQCR: 5.0000 mL | Freq: Two times a day (BID) | ORAL | Status: AC

## 2011-03-23 MED ORDER — BENZONATATE 100 MG PO CAPS: 200.0000 mg | ORAL_CAPSULE | Freq: Four times a day (QID) | ORAL | Status: AC | PRN

## 2011-03-23 NOTE — Progress Notes (Signed)
  Subjective:    Patient ID: Dana Sanders, female    DOB: 1961/04/29, 50 y.o.   MRN: [id number]  HPI The pt is a 50y/o female who I have been asked to see for chronic cough.  She started to cough about 3-4 mos ago, and was associated with fever to 102, sore throat, and myalgias.  She had no sinus symptoms at the time, and tells me her cxr was "ok".  She had a mild cough at that time.  She was treated with a course of cipro.  Despite this, the cough worsened, and she developed hoarseness.  Treated with tussionex and some type of inhaler, but only got relief with a steroid injection.  Her cough is currently a 2/10 in severity.  She did have spirometry during this time that was invalid due to an expiratory time less than 6 sec.  She was started on bid PPI, zyrtec, tramadol, and tussionex over the last month in order to improve her cough.  She describes a constant tickle in her throat, along with constant throat clearing.  The cough is worse with conversation, and also with deep breathing.  It is dry and hacky in nature, and can result in cough paroxysms.  She denies any h/o asthma, has minimal smoking history in the past, and denies gerd/sinus symptoms.   Review of Systems  Constitutional: Negative for fever and unexpected weight change.  HENT: Negative for ear pain, nosebleeds, congestion, sore throat, rhinorrhea, sneezing, trouble swallowing, dental problem, postnasal drip and sinus pressure.   Eyes: Negative for redness and itching.  Respiratory: Positive for cough. Negative for chest tightness, shortness of breath and wheezing.   Cardiovascular: Negative for palpitations and leg swelling.  Gastrointestinal: Negative for nausea and vomiting.  Genitourinary: Negative for dysuria.  Musculoskeletal: Negative for joint swelling.  Skin: Negative for rash.  Neurological: Negative for headaches.  Hematological: Does not bruise/bleed easily.  Psychiatric/Behavioral: Negative for dysphoric mood. The  patient is not nervous/anxious.        Objective:   Physical Exam Constitutional:  Well developed, no acute distress  HENT:  Nares patent without discharge  Oropharynx without exudate, palate and uvula are normal  Eyes:  Perrla, eomi, no scleral icterus  Neck:  No JVD, no TMG  Cardiovascular:  Normal rate, regular rhythm, no rubs or gallops.  No murmurs        Intact distal pulses  Pulmonary :  Normal breath sounds, no stridor or respiratory distress   No rales, rhonchi, or wheezing  Abdominal:  Soft, nondistended, bowel sounds present.  No tenderness noted.   Musculoskeletal:  No lower extremity edema noted.  Lymph Nodes:  No cervical lymphadenopathy noted  Skin:  No cyanosis noted  Neurologic:  Alert, appropriate, moves all 4 extremities without obvious deficit.         Assessment & Plan:

## 2011-03-23 NOTE — Patient Instructions (Signed)
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  No yelling, singing, or heavy exertion No throat clearing!!  Keep hard candy in your mouth during day to bathe back of your throat.  No peppermint or cough drops/menthol Use tessalon pearls during day for cough.  Do not wait until the last minute to take, and stay on top of your cough Can use tussionex at bedtime only if cough is keeping you awake. Take reflux meds for another 1-2 weeks only. Please call if your cough has not almost resolved in the next 1-2 weeks.

## 2011-03-23 NOTE — Assessment & Plan Note (Addendum)
The pt's cough is most suggestive of a cyclical cough that initially started with some type of respiratory infection.  She has seen considerable improvement, and her cough is now down to 2/10 with zero being no cough.  There is nothing to suggest sinusitis or reflux by history, and her spirometry by her primary md is normal but she only had an expiratory time of 4sec.  She tells me her recent cxr was normal.  At this point, would like to continue her on cough suppression with tessalon pearls during day and tussionex at night.  I have also reviewed the behavioral techniques that will help keep cough from propagating.  I suspect the cough  will resolve with a little more time provided she follows the above advice.

## 2011-03-31 ENCOUNTER — Encounter: Payer: Self-pay | Admitting: Pulmonary Disease

## 2011-04-07 ENCOUNTER — Telehealth: Payer: Self-pay | Admitting: Pulmonary Disease

## 2011-04-07 NOTE — Telephone Encounter (Signed)
Pt Father Dropped Off FMLA for his Daughter,sent to [facility] 04/07/11/km

## 2011-05-18 ENCOUNTER — Ambulatory Visit (HOSPITAL_BASED_OUTPATIENT_CLINIC_OR_DEPARTMENT_OTHER)
Admission: RE | Admit: 2011-05-18 | Discharge: 2011-05-18 | Disposition: A | Discharge status: Home / Self Care | Payer: 59 | Source: Ambulatory Visit | Attending: Orthopedic Surgery | Admitting: Orthopedic Surgery

## 2011-05-18 DIAGNOSIS — Z01812 Encounter for preprocedural laboratory examination: Secondary | ICD-10-CM | POA: Insufficient documentation

## 2011-05-18 DIAGNOSIS — M24149 Other articular cartilage disorders, unspecified hand: Secondary | ICD-10-CM | POA: Insufficient documentation

## 2011-05-18 DIAGNOSIS — D1610 Benign neoplasm of short bones of unspecified upper limb: Secondary | ICD-10-CM | POA: Insufficient documentation

## 2011-05-18 DIAGNOSIS — F411 Generalized anxiety disorder: Secondary | ICD-10-CM | POA: Insufficient documentation

## 2011-05-19 LAB — POCT HEMOGLOBIN-HEMACUE: Hemoglobin: 14.1 g/dL (ref 12.0–15.0)

## 2011-05-21 NOTE — Op Note (Signed)
Dana, Sanders           ACCOUNT NO.:  [id number]  MEDICAL RECORD NO.:  [id number]  LOCATION:                                 FACILITY:  PHYSICIAN:  Katy Fitch. Sypher, M.D. DATE OF BIRTH:  27-Feb-1961  DATE OF PROCEDURE:  05/18/2011 DATE OF DISCHARGE:                              OPERATIVE REPORT   PREOPERATIVE DIAGNOSIS:  Subacute rupture, right index finger metacarpophalangeal joint, radial collateral ligament.  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  OPERATION: 1. Resection of osteophyte, right index finger metacarpal head. 2. Reconstruction of radial collateral ligament origin utilizing a 3-0     FiberWire through-bone suture technique with a 0.045-inch Kirschner     wire placed to protect the reconstruction.  OPERATING SURGEON:  Katy Fitch. Sypher, MD  ASSISTANT:  Marveen Reeks Dasnoit, PA-C  ANESTHESIA:  General by LMA supplemented by postoperative lidocaine field block.  SUPERVISING ANESTHESIOLOGIST:  Quita Skye. 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  She developed pain, swelling, and then gross instability.  Clinical examination confirmed absence of the radial collateral ligament function.  After informed consent, she is brought to operating room at this time anticipating reconstruction.  Preoperatively, she is reminded of the potential risks and benefits of the surgery.  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  Preoperatively,  she was interviewed by Dr. Krista Blue who recommended general anesthesia by LMA technique.  A perioperative block was declined.  Questions were invited and answered in detail.  PROCEDURE:  Dana Sanders was brought to room #2 of the [facility] and placed in supine position upon operating table.  Under Dr. Robina Ade direct supervision, general anesthesia by LMA technique was induced.  The right arm was prepped with Betadine soap and solution and sterilely draped.  One gram of vancomycin was administered as IV prophylactic antibiotic.  Procedure commenced with a routine surgical time-out.  A curvilinear incision was planned to expose the dorsal aspect of the index metacarpophalangeal joint.  Following exsanguination of the right arm with an Esmarch bandage, arterial tourniquet was inflated to 220 mmHg.  Procedure commenced with sharp incision followed by elevation of full-thickness skin flaps.  The extensor mechanism was split between extensor indicis proprius and extensor digitorum longus.  The capsule was identified, incised longitudinally, and soft tissues overlying the region of the radial collateral ligament origin were elevated with a Beaver blade dissection.  There was a reactive osteophyte at the origin of the radial collateral ligament.  There was degenerative calcification noted and the origin was noted to be ruptured off the metacarpal head and neck.  The ligament was defined by circumferential dissection followed by removal of the osteophyte with rongeur dissection.  Retractors were placed followed by gathering the proximal origin of the  radial collateral ligament with a grasping suture of 3-0 FiberWire using a knotted technique.  Parallel 0.035-inch Kirschner wire drill holes were created through the metacarpal neck followed by threading sutures with a small Keith needle. The joint was reduced into position of 15 degrees of flexion and radial deviation and  slight supination, secured with a 0.045-inch Kirschner wire.  This was confirmed to be in satisfactory position with C-arm fluoroscopy.  The collateral ligament was then tensioned followed by tying 6 knots on the ulnar aspect of the metacarpal neck securing the FiberWire.  The capsule was then repaired in layers with 4-0 Vicryl and repair of the extensor hood with mattress sutures of 3-0 Ethibond, knots buried. The wound was irrigated thoroughly followed by repair of the skin with intradermal 3-0 Prolene.  For aftercare, [name] was placed in a forearm-based volar splint supporting the index finger.  There were no apparent complications.  For aftercare, she is provided prescriptions for Percocet 5 mg 1 p.o. q.4-6 h. p.r.n. pain, 30 tablets without refill, also Motrin 600 mg 1 p.o. q.6 h. p.r.n. pain, 30 tablets with no refills; and doxycycline 100 mg p.o. b.i.d. x4 days as prophylactic antibiotic.     Katy Fitch Sypher, M.D.     RVS/MEDQ  D:  05/18/2011  T:  05/18/2011  Job:  [id number]  Electronically Signed by Josephine Igo M.D. on 05/21/2011 09:09:26 AM

## 2011-12-08 ENCOUNTER — Other Ambulatory Visit: Payer: Self-pay | Admitting: Family Medicine

## 2011-12-08 ENCOUNTER — Ambulatory Visit (HOSPITAL_COMMUNITY)
Admission: RE | Admit: 2011-12-08 | Discharge: 2011-12-08 | Disposition: A | Discharge status: Home / Self Care | Payer: 59 | Source: Ambulatory Visit | Attending: Family Medicine | Admitting: Family Medicine

## 2011-12-08 DIAGNOSIS — R059 Cough, unspecified: Secondary | ICD-10-CM

## 2011-12-08 DIAGNOSIS — R05 Cough: Secondary | ICD-10-CM

## 2011-12-08 DIAGNOSIS — R079 Chest pain, unspecified: Secondary | ICD-10-CM | POA: Insufficient documentation

## 2012-02-28 ENCOUNTER — Other Ambulatory Visit: Payer: Self-pay | Admitting: Obstetrics and Gynecology

## 2012-02-28 ENCOUNTER — Ambulatory Visit
Admission: RE | Admit: 2012-02-28 | Discharge: 2012-02-28 | Disposition: A | Discharge status: Home / Self Care | Payer: 59 | Source: Ambulatory Visit | Attending: Obstetrics and Gynecology | Admitting: Obstetrics and Gynecology

## 2012-02-28 DIAGNOSIS — Z1231 Encounter for screening mammogram for malignant neoplasm of breast: Secondary | ICD-10-CM

## 2012-04-06 ENCOUNTER — Other Ambulatory Visit: Payer: Self-pay | Admitting: Obstetrics and Gynecology

## 2012-04-06 DIAGNOSIS — N6009 Solitary cyst of unspecified breast: Secondary | ICD-10-CM

## 2012-04-11 ENCOUNTER — Other Ambulatory Visit: Payer: 59

## 2012-04-14 ENCOUNTER — Other Ambulatory Visit: Payer: 59

## 2012-04-17 ENCOUNTER — Ambulatory Visit
Admission: RE | Admit: 2012-04-17 | Discharge: 2012-04-17 | Disposition: A | Discharge status: Home / Self Care | Payer: 59 | Source: Ambulatory Visit | Attending: Obstetrics and Gynecology | Admitting: Obstetrics and Gynecology

## 2012-04-17 DIAGNOSIS — N6009 Solitary cyst of unspecified breast: Secondary | ICD-10-CM

## 2012-08-07 ENCOUNTER — Other Ambulatory Visit (HOSPITAL_COMMUNITY): Payer: Self-pay | Admitting: Otolaryngology

## 2012-08-07 DIAGNOSIS — E041 Nontoxic single thyroid nodule: Secondary | ICD-10-CM

## 2012-08-10 ENCOUNTER — Ambulatory Visit (HOSPITAL_COMMUNITY)
Admission: RE | Admit: 2012-08-10 | Discharge: 2012-08-10 | Disposition: A | Discharge status: Home / Self Care | Payer: 59 | Source: Ambulatory Visit | Attending: Otolaryngology | Admitting: Otolaryngology

## 2012-08-10 DIAGNOSIS — E041 Nontoxic single thyroid nodule: Secondary | ICD-10-CM

## 2012-08-11 ENCOUNTER — Other Ambulatory Visit (HOSPITAL_COMMUNITY): Payer: 59

## 2012-09-08 ENCOUNTER — Ambulatory Visit (INDEPENDENT_AMBULATORY_CARE_PROVIDER_SITE_OTHER): Payer: 59 | Admitting: Adult Health

## 2012-09-08 ENCOUNTER — Encounter: Payer: Self-pay | Admitting: Adult Health

## 2012-09-08 VITALS — BP 104/68 | HR 85 | Temp 97.9°F | Ht 61.0 in | Wt 132.6 lb

## 2012-09-08 DIAGNOSIS — R059 Cough, unspecified: Secondary | ICD-10-CM

## 2012-09-08 DIAGNOSIS — R05 Cough: Secondary | ICD-10-CM

## 2012-09-08 DIAGNOSIS — R053 Chronic cough: Secondary | ICD-10-CM

## 2012-09-08 MED ORDER — HYDROCOD POLST-CHLORPHEN POLST 10-8 MG/5ML PO LQCR: 5.0000 mL | Freq: Two times a day (BID) | ORAL | Status: DC | PRN

## 2012-09-08 NOTE — Patient Instructions (Addendum)
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  No yelling, singing, or heavy exertion No throat clearing!!  Keep hard candy in your mouth during day to bathe back of your throat.  No peppermint or cough drops/menthol NO mints.  Tessalon pearls .Three times a day  As needed   Delsym 2 tsp Twice daily  For cough  Tussionex 1 tsp Twice daily  As needed  Cough.  Prilosec 20mg  daily for 3 weeks  Chlorphenarmine (chlor tabs) 4mg  2 tabs .At bedtime  For nasal drainage Zyrec 10mg  daily in am  For 2 weeks then As needed  Drainage.  Finish Prednisone taper .  follow up Dr. Shelle Iron as planned and As needed   Please contact office for sooner follow up if symptoms do not improve or worsen or seek emergency care

## 2012-09-08 NOTE — Progress Notes (Signed)
  Subjective:    Patient ID: Dana Sanders, female    DOB: 01-Jul-1961, 51 y.o.   MRN: [id number]  HPI 51 yo female with known hx of chronic cough  09/08/2012 Acute OV  Complains of hoarseness, increased SOB, hoarseness, dry hacking cough, PND, some sore throat x2 weeks - fever at onset. Has been using tessalon perles and delsym without much help Works in [place] , having rough time w/ coughing during work.  No discolored mucus , abd pain , n/v/d or dyspnea.  Cant stop coughing, keeping her up at night.  Was called in steroid taper by PCP . Started it today.      Review of Systems Constitutional:   No  weight loss, night sweats,  Fevers, chills,  +fatigue, or  lassitude.  HEENT:   No headaches,  Difficulty swallowing,  Tooth/dental problems, or  Sore throat,                No sneezing, itching, ear ache,  +nasal congestion, post nasal drip,   CV:  No chest pain,  Orthopnea, PND, swelling in lower extremities, anasarca, dizziness, palpitations, syncope.   GI  No heartburn, indigestion, abdominal pain, nausea, vomiting, diarrhea, change in bowel habits, loss of appetite, bloody stools.   Resp:   No coughing up of blood.    No chest wall deformity  Skin: no rash or lesions.  GU: no dysuria, change in color of urine, no urgency or frequency.  No flank pain, no hematuria   MS:  No joint pain or swelling.  No decreased range of motion.  No back pain.  Psych:  No change in mood or affect. No depression or anxiety.  No memory loss.      Objective:   Physical Exam GEN: A/Ox3; pleasant , NAD, well nourished   HEENT:  Rhea/AT,  EACs-clear, TMs-wnl, NOSE-clear drainage , THROAT-clear, no lesions, no postnasal drip or exudate noted.   NECK:  Supple w/ fair ROM; no JVD; normal carotid impulses w/o bruits; no thyromegaly or nodules palpated; no lymphadenopathy.  RESP  Clear  P & A; w/o, wheezes/ rales/ or rhonchi.no accessory muscle use, no dullness to percussion  CARD:  RRR, no  m/r/g  , no peripheral edema, pulses intact, no cyanosis or clubbing.  GI:   Soft & nt; nml bowel sounds; no organomegaly or masses detected.  Musco: Warm bil, no deformities or joint swelling noted.   Neuro: alert, no focal deficits noted.    Skin: Warm, no lesions or rashes         Assessment & Plan:

## 2012-09-08 NOTE — Assessment & Plan Note (Signed)
Flare with recent URI   Plan Work on minimizing your voice as much as possible.  No yelling, singing, or heavy exertion No throat clearing!!  Keep hard candy in your mouth during day to bathe back of your throat.  No peppermint or cough drops/menthol NO mints.  Tessalon pearls .Three times a day  As needed   Delsym 2 tsp Twice daily  For cough  Tussionex 1 tsp Twice daily  As needed  Cough.  Prilosec 20mg  daily for 3 weeks  Chlorphenarmine (chlor tabs) 4mg  2 tabs .At bedtime  For nasal drainage Zyrec 10mg  daily in am  For 2 weeks then As needed  Drainage.  Finish Prednisone taper .  follow up Dr. Shelle Iron as planned and As needed   Please contact office for sooner follow up if symptoms do not improve or worsen or seek emergency care

## 2012-09-11 ENCOUNTER — Other Ambulatory Visit (HOSPITAL_COMMUNITY)
Admission: RE | Admit: 2012-09-11 | Discharge: 2012-09-11 | Disposition: A | Discharge status: Home / Self Care | Payer: 59 | Source: Ambulatory Visit | Attending: Otolaryngology | Admitting: Otolaryngology

## 2012-09-11 ENCOUNTER — Other Ambulatory Visit: Payer: Self-pay | Admitting: Otolaryngology

## 2012-09-11 DIAGNOSIS — E049 Nontoxic goiter, unspecified: Secondary | ICD-10-CM | POA: Insufficient documentation

## 2012-09-11 NOTE — Progress Notes (Signed)
Ov reviewed and agree with plan as outlined.  

## 2012-09-18 ENCOUNTER — Telehealth: Payer: Self-pay | Admitting: Pulmonary Disease

## 2012-09-18 NOTE — Telephone Encounter (Signed)
OV with dr clance - first available- ok to double book Delsym OTC  5-10 ml thrice daily

## 2012-09-18 NOTE — Telephone Encounter (Signed)
Spoke with pt She is c/o her cough being no better since last visit with TP on 12/20 Cough is non prod and hacky She is very hoarse Voice just comes and goes She is on PPI bid, tussionex and tessalon with no relief No appts open this wk Please advise, thanks! Allergies  Allergen Reactions  . Erythromycin Hives  . Other     FOOD ALLERGY: Anaphylactic reaction to MELONS  . Penicillins Hives

## 2012-09-18 NOTE — Telephone Encounter (Signed)
Not to double-book KC's schedule.    Called spoke with patient and advised of RA's recs with the delsym TID.  Pt wonders if she needs an abx as she can produce small amounts of green mucus but it is "few and far between."  Pt okay with call back tomorrow.  As KC will not be in the office and pt recently saw TP, will forward to her for recs.  Pt will finish her pred taper from 12.20.13 ov tomorrow.  Tammy please advise, thanks.

## 2012-09-19 MED ORDER — HYDROCODONE-HOMATROPINE 5-1.5 MG/5ML PO SYRP: 5.0000 mL | ORAL_SOLUTION | ORAL | Status: DC | PRN

## 2012-09-19 MED ORDER — CEFDINIR 300 MG PO CAPS: 300.0000 mg | ORAL_CAPSULE | Freq: Two times a day (BID) | ORAL | Status: DC

## 2012-09-19 NOTE — Telephone Encounter (Signed)
Scripts telephoned to pharmacist [place] at [facility].  MAR updated.

## 2012-09-19 NOTE — Telephone Encounter (Signed)
Omnicef 300mg  Twice daily  #14 no refills Finish Prednisone .  Stop Tussionex Begin  to Hydromet #8 oz 1-2 tsp every 4-6 hr As needed  Cough , may cause sedation  follow up in 2 weeks with Dr. Shelle Iron  Please contact office for sooner follow up if symptoms do not improve or worsen or seek emergency care  Call in to [facility] pharmacy .

## 2012-09-20 ENCOUNTER — Emergency Department (HOSPITAL_COMMUNITY): Payer: 59

## 2012-09-20 ENCOUNTER — Emergency Department (HOSPITAL_COMMUNITY)
Admission: EM | Admit: 2012-09-20 | Discharge: 2012-09-20 | Disposition: A | Discharge status: Home / Self Care | Payer: 59 | Attending: Emergency Medicine | Admitting: Emergency Medicine

## 2012-09-20 ENCOUNTER — Encounter (HOSPITAL_COMMUNITY): Payer: Self-pay | Admitting: Emergency Medicine

## 2012-09-20 DIAGNOSIS — R072 Precordial pain: Secondary | ICD-10-CM | POA: Insufficient documentation

## 2012-09-20 DIAGNOSIS — R0789 Other chest pain: Secondary | ICD-10-CM

## 2012-09-20 DIAGNOSIS — Z79899 Other long term (current) drug therapy: Secondary | ICD-10-CM | POA: Insufficient documentation

## 2012-09-20 DIAGNOSIS — R05 Cough: Secondary | ICD-10-CM | POA: Insufficient documentation

## 2012-09-20 DIAGNOSIS — R059 Cough, unspecified: Secondary | ICD-10-CM | POA: Insufficient documentation

## 2012-09-20 DIAGNOSIS — Z87891 Personal history of nicotine dependence: Secondary | ICD-10-CM | POA: Insufficient documentation

## 2012-09-20 LAB — CBC
MCH: 30.1 pg (ref 26.0–34.0)
MCHC: 34.9 g/dL (ref 30.0–36.0)
Platelets: 292 10*3/uL (ref 150–400)
RBC: 4.32 MIL/uL (ref 3.87–5.11)

## 2012-09-20 LAB — BASIC METABOLIC PANEL
Calcium: 9.8 mg/dL (ref 8.4–10.5)
GFR calc non Af Amer: 80 mL/min — ABNORMAL LOW (ref >90)
Sodium: 137 mEq/L (ref 135–145)

## 2012-09-20 LAB — POCT I-STAT TROPONIN I: Troponin i, poc: 0 ng/mL (ref 0.00–0.08)

## 2012-09-20 MED ORDER — KETOROLAC TROMETHAMINE 30 MG/ML IJ SOLN
30.0000 mg | Freq: Once | INTRAMUSCULAR | Status: AC
  Administered 2012-09-20: 30 mg via INTRAVENOUS
  Filled 2012-09-20: qty 1

## 2012-09-20 MED ORDER — DIAZEPAM 5 MG PO TABS: 5.0000 mg | ORAL_TABLET | Freq: Every evening | ORAL | Status: DC | PRN

## 2012-09-20 MED ORDER — MORPHINE SULFATE 4 MG/ML IJ SOLN
4.0000 mg | Freq: Once | INTRAMUSCULAR | Status: AC
  Administered 2012-09-20: 4 mg via INTRAVENOUS
  Filled 2012-09-20: qty 1

## 2012-09-20 MED ORDER — ALBUTEROL SULFATE HFA 108 (90 BASE) MCG/ACT IN AERS: 1.0000 | INHALATION_SPRAY | Freq: Four times a day (QID) | RESPIRATORY_TRACT | Status: DC | PRN

## 2012-09-20 MED ORDER — OXYCODONE-ACETAMINOPHEN 5-325 MG PO TABS
2.0000 | ORAL_TABLET | Freq: Once | ORAL | Status: AC
  Administered 2012-09-20: 2 via ORAL
  Filled 2012-09-20: qty 2

## 2012-09-20 MED ORDER — IPRATROPIUM BROMIDE 0.02 % IN SOLN
0.5000 mg | Freq: Once | RESPIRATORY_TRACT | Status: AC
  Administered 2012-09-20: 0.5 mg via RESPIRATORY_TRACT
  Filled 2012-09-20: qty 2.5

## 2012-09-20 MED ORDER — ALBUTEROL SULFATE (5 MG/ML) 0.5% IN NEBU
5.0000 mg | INHALATION_SOLUTION | Freq: Once | RESPIRATORY_TRACT | Status: AC
  Administered 2012-09-20: 5 mg via RESPIRATORY_TRACT
  Filled 2012-09-20: qty 1

## 2012-09-20 MED ORDER — OXYCODONE-ACETAMINOPHEN 5-325 MG PO TABS: 2.0000 | ORAL_TABLET | ORAL | Status: DC | PRN

## 2012-09-20 NOTE — ED Provider Notes (Signed)
History     CSN: [id number]  Arrival date & time 09/20/12  1636   First MD Initiated Contact with Patient 09/20/12 1700      Chief Complaint  Patient presents with  . Chest Pain    (Consider location/radiation/quality/duration/timing/severity/associated sxs/prior treatment) The history is provided by the patient.  Dana Sanders is a 52 y.o. female here with cough and chest pain. Intermittent cough for the last 3 weeks but worse in the last week. She finished a course of prednisone taper and Tessalon Pearles. She was prescribed Omnicef by her pulmonologist yesterday without much movement. Since yesterday she had substernal chest pain every time she coughs. No fevers. No hx of COPD and she is not a smoker.    History reviewed. No pertinent past medical history.  Past Surgical History  Procedure Date  . Shoulder surgery     bilateral  . Breast surgery   . Total abdominal hysterectomy   . Septorhinoplasty     Family History  Problem Relation Age of Onset  . Heart disease Father     pacemaker  . Allergies Son   . Rheum arthritis Sister   . Leukemia Daughter     History  Substance Use Topics  . Smoking status: Former Smoker -- 0.2 packs/day for 15 years    Types: Cigarettes    Quit date: 09/20/2005  . Smokeless tobacco: Not on file  . Alcohol Use: Not on file    OB History    Grav Para Term Preterm Abortions TAB SAB Ect Mult Living                  Review of Systems  Respiratory: Positive for cough.   Cardiovascular: Positive for chest pain.  All other systems reviewed and are negative.    Allergies  Erythromycin; Other; and Penicillins  Home Medications   Current Outpatient Rx  Name  Route  Sig  Dispense  Refill  . BENZONATATE 100 MG PO CAPS   Oral   Take 200 mg by mouth every 6 (six) hours as needed.         Marland Kitchen CEFDINIR 300 MG PO CAPS   Oral   Take 300 mg by mouth 2 (two) times daily. THERAPY FOR 7 DAYS ON DAY 1         .  DEXTROMETHORPHAN POLISTIREX ER 30 MG/5ML PO LQCR   Oral   Take 60 mg by mouth as needed. COUGH         . HYDROCODONE-HOMATROPINE 5-1.5 MG/5ML PO SYRP   Oral   Take 5 mLs by mouth every 4 (four) hours as needed for cough.   240 mL   0   . OMEPRAZOLE 40 MG PO CPDR   Oral   Take 40 mg by mouth 2 (two) times daily.           . SERTRALINE HCL 100 MG PO TABS   Oral   Take 100 mg by mouth daily.           BP 117/82  Pulse 90  Temp 98.3 F (36.8 C) (Oral)  Resp 15  SpO2 100%  Physical Exam  Nursing note and vitals reviewed. Constitutional: She is oriented to person, place, and time.       Coughing, uncomfortable   HENT:  Head: Normocephalic.  Mouth/Throat: Oropharynx is clear and moist.  Eyes: Conjunctivae normal are normal. Pupils are equal, round, and reactive to light.  Neck: Normal range of motion. Neck supple.  Cardiovascular: Normal rate, regular rhythm and normal heart sounds.   Pulmonary/Chest:       Tachypneic, no wheezing. + reproducible chest tenderness   Abdominal: Soft. Bowel sounds are normal. She exhibits no distension. There is no tenderness. There is no rebound and no guarding.  Musculoskeletal: Normal range of motion.  Neurological: She is alert and oriented to person, place, and time.  Skin: Skin is warm and dry.  Psychiatric: She has a normal mood and affect. Her behavior is normal. Judgment and thought content normal.    ED Course  Procedures (including critical care time)  Labs Reviewed  BASIC METABOLIC PANEL - Abnormal; Notable for the following:    GFR calc non Af Amer 80 (*)     All other components within normal limits  CBC  POCT I-STAT TROPONIN I   Dg Chest 2 View  09/20/2012  *RADIOLOGY REPORT*  Clinical Data: Chest pain and congestion  CHEST - 2 VIEW  Comparison: None  Findings: The heart size and mediastinal contours are within normal limits.  Both lungs are clear.  The visualized skeletal structures are unremarkable.  IMPRESSION: No  active cardiopulmonary abnormalities.   Original Report Authenticated By: Signa Kell, M.D.      No diagnosis found.   Date: 09/20/2012  Rate: 84  Rhythm: normal sinus rhythm  QRS Axis: normal  Intervals: normal  ST/T Wave abnormalities: normal  Conduction Disutrbances:none  Narrative Interpretation:   Old EKG Reviewed: none available    MDM  Dana Sanders is a 52 y.o. female here with cough and reproducible chest pain. Chest pain is likely from muscle strain from coughing. Low risk for ACS and PE. Will get labs, pain meds, CXR, and reassess.   7:24 PM CXR nl, labs unremarkable. Pain improved with toradol, morphine, and percocet. I think she has muscle strain from coughing. Will d/c home with albuterol prn, percocet and valium prn, and motrin round the clock.         Richardean Canal, MD 09/20/12 [phone]

## 2012-09-20 NOTE — ED Notes (Signed)
Discharge instructions reviewed. Rx given x3.  

## 2012-09-20 NOTE — ED Notes (Signed)
RT at bedside.

## 2012-09-20 NOTE — ED Notes (Signed)
Pt c/o cough x 3 weeks, has taken multiple medications without relief. Pt developed severe, sharp chest pain, slightly left of sternum.

## 2012-09-21 ENCOUNTER — Ambulatory Visit (INDEPENDENT_AMBULATORY_CARE_PROVIDER_SITE_OTHER): Payer: 59 | Admitting: Pulmonary Disease

## 2012-09-21 ENCOUNTER — Encounter: Payer: Self-pay | Admitting: Pulmonary Disease

## 2012-09-21 ENCOUNTER — Telehealth: Payer: Self-pay | Admitting: Pulmonary Disease

## 2012-09-21 ENCOUNTER — Encounter (HOSPITAL_COMMUNITY): Payer: Self-pay

## 2012-09-21 ENCOUNTER — Observation Stay (HOSPITAL_COMMUNITY)
Admission: AD | Admit: 2012-09-21 | Discharge: 2012-09-23 | Disposition: A | Discharge status: Home Health | Payer: 59 | Source: Ambulatory Visit | Attending: Pulmonary Disease | Admitting: Pulmonary Disease

## 2012-09-21 VITALS — BP 138/90 | HR 104 | Temp 98.2°F | Ht 61.0 in | Wt 134.6 lb

## 2012-09-21 DIAGNOSIS — R05 Cough: Principal | ICD-10-CM

## 2012-09-21 DIAGNOSIS — Z79899 Other long term (current) drug therapy: Secondary | ICD-10-CM | POA: Insufficient documentation

## 2012-09-21 DIAGNOSIS — R509 Fever, unspecified: Secondary | ICD-10-CM | POA: Insufficient documentation

## 2012-09-21 DIAGNOSIS — R053 Chronic cough: Secondary | ICD-10-CM

## 2012-09-21 DIAGNOSIS — G4701 Insomnia due to medical condition: Secondary | ICD-10-CM

## 2012-09-21 DIAGNOSIS — G4700 Insomnia, unspecified: Secondary | ICD-10-CM | POA: Insufficient documentation

## 2012-09-21 DIAGNOSIS — R059 Cough, unspecified: Secondary | ICD-10-CM

## 2012-09-21 HISTORY — DX: Depression, unspecified: F32.A

## 2012-09-21 HISTORY — DX: Major depressive disorder, single episode, unspecified: F32.9

## 2012-09-21 MED ORDER — DIPHENHYDRAMINE HCL 25 MG PO CAPS
25.0000 mg | ORAL_CAPSULE | Freq: Two times a day (BID) | ORAL | Status: DC
  Administered 2012-09-21 (×2): 25 mg via ORAL
  Filled 2012-09-21 (×4): qty 1

## 2012-09-21 MED ORDER — ONDANSETRON HCL 4 MG/2ML IJ SOLN
INTRAMUSCULAR | Status: AC
  Administered 2012-09-21: 4 mg
  Filled 2012-09-21: qty 2

## 2012-09-21 MED ORDER — DEXTROMETHORPHAN POLISTIREX 30 MG/5ML PO LQCR
10.0000 mL | Freq: Three times a day (TID) | ORAL | Status: DC
  Administered 2012-09-21 (×3): 60 mg via ORAL
  Filled 2012-09-21 (×6): qty 10

## 2012-09-21 MED ORDER — ALBUTEROL SULFATE (5 MG/ML) 0.5% IN NEBU
2.5000 mg | INHALATION_SOLUTION | Freq: Four times a day (QID) | RESPIRATORY_TRACT | Status: DC | PRN
  Administered 2012-09-21: 2.5 mg via RESPIRATORY_TRACT
  Filled 2012-09-21: qty 0.5

## 2012-09-21 MED ORDER — ONDANSETRON HCL 4 MG/2ML IJ SOLN: 4.0000 mg | Freq: Three times a day (TID) | INTRAMUSCULAR | Status: DC | PRN

## 2012-09-21 MED ORDER — SODIUM CHLORIDE 0.9 % IJ SOLN
3.0000 mL | Freq: Two times a day (BID) | INTRAMUSCULAR | Status: DC
  Administered 2012-09-21 – 2012-09-22 (×3): 3 mL via INTRAVENOUS

## 2012-09-21 MED ORDER — PROMETHAZINE-CODEINE 6.25-10 MG/5ML PO SYRP
5.0000 mL | ORAL_SOLUTION | Freq: Four times a day (QID) | ORAL | Status: DC | PRN
  Administered 2012-09-21 (×2): 5 mL via ORAL
  Filled 2012-09-21 (×2): qty 5

## 2012-09-21 MED ORDER — ALPRAZOLAM 0.5 MG PO TABS
0.5000 mg | ORAL_TABLET | Freq: Every evening | ORAL | Status: DC | PRN
  Administered 2012-09-21 – 2012-09-22 (×2): 0.5 mg via ORAL
  Filled 2012-09-21 (×3): qty 1

## 2012-09-21 MED ORDER — DIPHENHYDRAMINE HCL 25 MG PO TABS: 25.0000 mg | ORAL_TABLET | Freq: Two times a day (BID) | ORAL | Status: DC

## 2012-09-21 MED ORDER — ACETAMINOPHEN 325 MG PO TABS
650.0000 mg | ORAL_TABLET | Freq: Four times a day (QID) | ORAL | Status: DC | PRN
  Administered 2012-09-21: 650 mg via ORAL
  Filled 2012-09-21: qty 2

## 2012-09-21 MED ORDER — SODIUM CHLORIDE 0.9 % IV SOLN: 250.0000 mL | INTRAVENOUS | Status: DC | PRN

## 2012-09-21 MED ORDER — PANTOPRAZOLE SODIUM 40 MG PO TBEC
40.0000 mg | DELAYED_RELEASE_TABLET | Freq: Two times a day (BID) | ORAL | Status: DC
  Administered 2012-09-21 – 2012-09-23 (×4): 40 mg via ORAL
  Filled 2012-09-21 (×7): qty 1

## 2012-09-21 MED ORDER — PSEUDOEPHEDRINE HCL 60 MG PO TABS
60.0000 mg | ORAL_TABLET | Freq: Two times a day (BID) | ORAL | Status: DC
Start: 2012-09-21 — End: 2012-09-22
  Administered 2012-09-21 (×2): 60 mg via ORAL
  Filled 2012-09-21 (×5): qty 1

## 2012-09-21 MED ORDER — MORPHINE SULFATE 2 MG/ML IJ SOLN
1.0000 mg | INTRAMUSCULAR | Status: DC | PRN
  Administered 2012-09-21 – 2012-09-22 (×8): 2 mg via INTRAVENOUS
  Filled 2012-09-21 (×8): qty 1

## 2012-09-21 MED ORDER — SODIUM CHLORIDE 0.9 % IJ SOLN: 3.0000 mL | INTRAMUSCULAR | Status: DC | PRN

## 2012-09-21 MED ORDER — BENZONATATE 100 MG PO CAPS
200.0000 mg | ORAL_CAPSULE | Freq: Three times a day (TID) | ORAL | Status: DC
  Administered 2012-09-21 – 2012-09-22 (×6): 200 mg via ORAL
  Filled 2012-09-21 (×9): qty 2

## 2012-09-21 MED ORDER — ALPRAZOLAM 0.25 MG PO TABS
0.2500 mg | ORAL_TABLET | Freq: Every evening | ORAL | Status: DC | PRN
  Administered 2012-09-21: 0.25 mg via ORAL
  Filled 2012-09-21: qty 1

## 2012-09-21 NOTE — Telephone Encounter (Signed)
Called, spoke with pt.  States she went to [facility] ER last night d/t cough and sharp left sided chest pain.  Pt reports cough is nonprod and chest pain is mainly when breathing in deeply or when coughing.  Reports she is coughing "all the time" and not sleeping.  States they gave her pain meds and breathing txs in the ER.  Pt states this helped then but now she is in the same position she was in before going to the ER.  Denies increased SOB unless she is having coughing spells and no wheezing.  Pt requesting something to be done.  Per phone msg from 09/18/12:  Oretha Milch., MD 09/18/2012 5:14 PM Signed  OV with dr clance - first available- ok to double book --- Spoke with Dr. Shelle Iron as he doesn't have any openings today.  He is ok with pt seeing another provider today if this is what pt wants to do.  Spoke with pt and offered OV today at 9:30am with RA.  Pt would like to come in for this appt.  OV scheduled -- pt aware.

## 2012-09-21 NOTE — Patient Instructions (Addendum)
We will admit you for intensive therapy for cough

## 2012-09-21 NOTE — Assessment & Plan Note (Signed)
Sub acute x  4wks Admit for intensive therapy

## 2012-09-21 NOTE — Progress Notes (Signed)
eLink Physician-Brief Progress Note Patient Name: Dana Sanders DOB: [birth year] MRN: [id number]  Date of Service  09/21/2012   HPI/Events of Note   Insomnia.  Patient requesting Xanax 1 TID - she reportedly took it at some point but it is not on her preadmission medications record.   eICU Interventions   Benadryl already ordered PRN.  Xanax 0.25 was ordered earlier.  Will order another 0.5 x1 for tonight.  Schedule dosing to be addressed by rounding MD in AM.    Intervention Category Intermediate Interventions: Other:  Lonia Farber 09/21/2012, 9:45 PM

## 2012-09-21 NOTE — Progress Notes (Signed)
  Subjective:    Patient ID: Dana Sanders, female    DOB: 1961-09-12, 52 y.o.   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  Has been using tessalon perles and delsym without much help  Works in [place] , having rough time w/ coughing during work.  No discolored mucus , abd pain , n/v/d or dyspnea.  Cant stop coughing, keeping her up at night.  Was called in steroid taper by PCP . Started it today >>> Tessalon pearls .Three times a day As needed  Delsym 2 tsp Twice daily For cough  Tussionex 1 tsp Twice daily As needed Cough.  Prilosec 20mg  daily for 3 weeks  Chlorphenarmine (chlor tabs) 4mg  2 tabs .At bedtime For nasal drainage  Zyrec 10mg  daily in am For 2 weeks then As needed Drainage.  Finish Prednisone taper   09/21/2012 She tried above Rx but did not feel better. Required ED visit 1/1 due to  c/o severe CP left side. c/o dry hacking cough, hoarseness, not able to sleep at night. had cxr and EKG at the ED  and both were fine. Chest pain is likely from muscle strain from coughing d/c home with albuterol prn, percocet and valium prn, and motrin round the clock.  She is tearful in the office today & very distressed due tot he cough has not slept in 2 days Remains non productive, no wheeze Reports sore throat at onset  No past medical history on file.   Review of Systems  neg for any significant sore throat, dysphagia, itching, sneezing, nasal congestion or excess/ purulent secretions, fever, chills, sweats, unintended wt loss, pleuritic or exertional cp, hempoptysis, orthopnea pnd or change in chronic leg swelling. Also denies presyncope, palpitations, heartburn, abdominal pain, nausea, vomiting, diarrhea or change in bowel or urinary habits, dysuria,hematuria, rash, arthralgias,  visual complaints, headache, numbness weakness or ataxia.     Objective:   Physical Exam  Gen. Pleasant, well-nourished, in no distress, normal affect ENT - no lesions, no post nasal drip Neck: No JVD, no thyromegaly, no carotid bruits Lungs: no use of accessory muscles, no dullness to percussion, clear without rales or rhonchi  Cardiovascular: Rhythm regular, heart sounds  normal, no murmurs or gallops, no peripheral edema Abdomen: soft and non-tender, no hepatosplenomegaly, BS normal. Musculoskeletal: No deformities, no cyanosis or clubbing Neuro:  alert, non focal        Assessment & Plan:

## 2012-09-21 NOTE — H&P (Signed)
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  Has been using tessalon perles and delsym without much help  Works in [place] , having rough time w/ coughing during work.  No discolored mucus , abd pain , n/v/d or dyspnea.  Cant stop coughing, keeping her up at night.  Was called in steroid taper by PCP . Started it today  >>>  Tessalon pearls .Three times a day As needed  Delsym 2 tsp Twice daily For cough  Tussionex 1 tsp Twice daily As needed Cough.  Prilosec 20mg  daily for 3 weeks  Chlorphenarmine (chlor tabs) 4mg  2 tabs .At bedtime For nasal drainage  Zyrec 10mg  daily in am For 2 weeks then As needed Drainage.  Finish Prednisone taper  09/21/2012 She tried above Rx but did not feel better. Required ED visit 1/1 due to c/o severe CP left side. c/o dry hacking cough, hoarseness, not able to sleep at night. had cxr and EKG at the ED and both were fine. Chest pain is likely from muscle strain from coughing  d/c home with albuterol prn, percocet and valium prn, and motrin round the clock.  She is tearful in the office today & very distressed due tot he cough has not slept in 2 days  Remains non productive, no wheeze  Reports sore throat at onset   PAST MEDICAL HISTORY :  Past Medical History  Diagnosis Date  . Depression    Past Surgical History  Procedure Date  . Shoulder surgery     bilateral  . Breast surgery   . Total abdominal hysterectomy   . Septorhinoplasty    Prior to Admission medications   Medication Sig Start Date End Date Taking? Authorizing Provider  albuterol (PROVENTIL  HFA;VENTOLIN HFA) 108 (90 BASE) MCG/ACT inhaler Inhale 1-2 puffs into the lungs every 6 (six) hours as needed for wheezing. 09/20/12  Yes Richardean Canal, MD  benzonatate (TESSALON) 100 MG capsule Take 200 mg by mouth every 6 (six) hours as needed.   Yes Historical Provider, MD  cefdinir (OMNICEF) 300 MG capsule Take 300 mg by mouth 2 (two) times daily. x7 days 09/19/12  Yes Tammy S Parrett, NP  dextromethorphan (DELSYM) 30 MG/5ML liquid Take 60 mg by mouth as needed. COUGH   Yes Historical Provider, MD  diazepam (VALIUM) 5 MG tablet Take 1 tablet (5 mg total) by mouth at bedtime as needed for anxiety. 09/20/12  Yes Richardean Canal, MD  HYDROcodone-homatropine (HYDROMET) 5-1.5 MG/5ML syrup Take 5 mLs by mouth every 4 (four) hours as needed for cough. 09/19/12  Yes Tammy S Parrett, NP  omeprazole (PRILOSEC) 40 MG capsule Take 40 mg by mouth 2 (two) times daily.     Yes Historical Provider, MD  sertraline (ZOLOFT) 100 MG tablet Take 100 mg by mouth daily.   Yes Historical Provider, MD  oxyCODONE-acetaminophen (PERCOCET) 5-325 MG per tablet Take 2 tablets by mouth every 4 (four) hours as needed for pain. 09/20/12   Richardean Canal, MD   Allergies  Allergen Reactions  .  Erythromycin Hives  . Other     FOOD ALLERGY: Anaphylactic reaction to MELONS  . Penicillins Hives    FAMILY HISTORY:  Family History  Problem Relation Age of Onset  . Heart disease Father     pacemaker  . Allergies Son   . Rheum arthritis Sister   . Leukemia Daughter    SOCIAL HISTORY:  reports that she quit smoking about 7 years ago. Her smoking use included Cigarettes. She has a 3 pack-year smoking history. She has never used smokeless tobacco. She reports that she does not use illicit drugs. Her alcohol history not on file.  REVIEW OF SYSTEMS:   Constitutional: Negative for fever, chills, weight loss, malaise/fatigue and diaphoresis.  HENT: Negative for hearing loss, ear pain, nosebleeds, congestion, sore throat, neck pain, tinnitus and  ear discharge.   Eyes: Negative for blurred vision, double vision, photophobia, pain, discharge and redness.  Respiratory: Negative for  hemoptysis, sputum production, shortness of breath, wheezing and stridor.   Cardiovascular: Negative for chest pain, palpitations, orthopnea, claudication, leg swelling and PND.  Gastrointestinal: Negative for heartburn, nausea, vomiting, abdominal pain, diarrhea, constipation, blood in stool and melena.  Genitourinary: Negative for dysuria, urgency, frequency, hematuria and flank pain.  Musculoskeletal: Negative for myalgias, back pain, joint pain and falls.  Skin: Negative for itching and rash.  Neurological: Negative for dizziness, tingling, tremors, sensory change, speech change, focal weakness, seizures, loss of consciousness, weakness and headaches.  Endo/Heme/Allergies: Negative for environmental allergies and polydipsia. Does not bruise/bleed easily.  INTERVAL HISTORY:   VITAL SIGNS: Temp:  [97.5 F (36.4 C)-98.3 F (36.8 C)] 97.5 F (36.4 C) (01/02 1031) Pulse Rate:  [77-104] 83  (01/02 1031) Resp:  [13-22] 20  (01/02 1031) BP: (117-145)/(71-101) 128/71 mmHg (01/02 1031) SpO2:  [97 %-100 %] 99 % (01/02 1031) Weight:  [131 lb (59.421 kg)-134 lb 9.6 oz (61.054 kg)] 131 lb (59.421 kg) (01/02 1031)  PHYSICAL EXAMINATION: Gen. anxious, tearful,well-nourished, in mod distress with paroxysms of coughing ENT - no lesions, no post nasal drip Neck: No JVD, no thyromegaly, no carotid bruits Lungs: no use of accessory muscles, no dullness to percussion, clear without rales or rhonchi  Cardiovascular: Rhythm regular, heart sounds  normal, no murmurs, no peripheral edema Abdomen: soft and non-tender, no hepatosplenomegaly, BS normal. Musculoskeletal: No deformities, no cyanosis or clubbing Neuro:  alert, non focal Skin:  Warm, no lesions/ rash    Lab 09/20/12 1725  NA 137  K 4.0  CL 100  CO2 27  BUN 17  CREATININE 0.84  GLUCOSE 92    Lab  09/20/12 1725  HGB 13.0  HCT 37.2  WBC 9.9  PLT 292   Dg Chest 2 View  09/20/2012  *RADIOLOGY REPORT*  Clinical Data: Chest pain and congestion  CHEST - 2 VIEW  Comparison: None  Findings: The heart size and mediastinal contours are within normal limits.  Both lungs are clear.  The visualized skeletal structures are unremarkable.  IMPRESSION: No active cardiopulmonary abnormalities.   Original Report Authenticated By: Signa Kell, M.D.     ASSESSMENT / PLAN:  Severe intractable cough with nml CXR - DD remains upper airway cough most likely with GERD , perhaps contributing  Plan Admit for observation & IV therpay since she has not slept in 2 ds & is severely distressed by it, also has faile doutpt therapy - Iv morphine 1-2 mg q 3h prn severe cough -Round the clock cough suppression with tid delsym & promethazine- codeine  -  CPM 8mg  qhs + sudafed 60 bid -bid PPI  Can discharge once improved hopefully in 24-48h & fu with dr Rosalyn Gess   Pulmonary and Critical Care Medicine [facility] Pager: [phone]  09/21/2012, 1:30 PM

## 2012-09-22 ENCOUNTER — Observation Stay (HOSPITAL_COMMUNITY): Payer: 59

## 2012-09-22 LAB — BASIC METABOLIC PANEL
CO2: 31 mEq/L (ref 19–32)
Chloride: 101 mEq/L (ref 96–112)
GFR calc non Af Amer: 71 mL/min — ABNORMAL LOW (ref >90)
Glucose, Bld: 97 mg/dL (ref 70–99)
Potassium: 3.8 mEq/L (ref 3.5–5.1)
Sodium: 137 mEq/L (ref 135–145)

## 2012-09-22 LAB — CBC
Hemoglobin: 12.7 g/dL (ref 12.0–15.0)
RBC: 4.21 MIL/uL (ref 3.87–5.11)
WBC: 6.1 10*3/uL (ref 4.0–10.5)

## 2012-09-22 MED ORDER — TRAMADOL HCL 50 MG PO TABS
50.0000 mg | ORAL_TABLET | Freq: Four times a day (QID) | ORAL | Status: DC
Start: 2012-09-22 — End: 2012-09-23
  Administered 2012-09-22 – 2012-09-23 (×5): 50 mg via ORAL
  Filled 2012-09-22 (×9): qty 1

## 2012-09-22 MED ORDER — PROMETHAZINE-CODEINE 6.25-10 MG/5ML PO SYRP
5.0000 mL | ORAL_SOLUTION | Freq: Four times a day (QID) | ORAL | Status: DC
  Administered 2012-09-22 (×2): 5 mL via ORAL
  Filled 2012-09-22 (×3): qty 5
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
## 2012-09-22 NOTE — H&P (Signed)
PULMONARY  / CRITICAL CARE MEDICINE  Name: Dana Sanders MRN: [id number] DOB: 15-Dec-1960    LOS: 1  PULM PROVIDER:  Clance  Brief Description :  52 yo WF admitted 1/2 for intractable cough w/ OP failure   Studies :  CT sinus 1/2 >neg   INTERVAL HISTORY:   VITAL SIGNS: Temp:  [97.8 F (36.6 C)-98.4 F (36.9 C)] 97.8 F (36.6 C) (01/03 0645) Pulse Rate:  [79-90] 79  (01/03 0645) Resp:  [18-20] 18  (01/03 0645) BP: (112-128)/(79-82) 114/80 mmHg (01/03 0645) SpO2:  [100 %] 100 % (01/03 0645)  PHYSICAL EXAMINATION: Gen. anxious, tearful,well-nourished, in mod distress with paroxysms of coughing ENT - no lesions, no post nasal drip Neck: No JVD, no thyromegaly, no carotid bruits Lungs: no use of accessory muscles, no dullness to percussion, clear without rales or rhonchi  Cardiovascular: Rhythm regular, heart sounds  normal, no murmurs, no peripheral edema Abdomen: soft and non-tender, no hepatosplenomegaly, BS normal. Musculoskeletal: No deformities, no cyanosis or clubbing Neuro:  alert, non focal Skin:  Warm, no lesions/ rash    Lab 09/22/12 0406 09/20/12 1725  NA 137 137  K 3.8 4.0  CL 101 100  CO2 31 27  BUN 10 17  CREATININE 0.92 0.84  GLUCOSE 97 92    Lab 09/22/12 0406 09/20/12 1725  HGB 12.7 13.0  HCT 36.9 37.2  WBC 6.1 9.9  PLT 243 292   Dg Chest 2 View  09/20/2012  *RADIOLOGY REPORT*  Clinical Data: Chest pain and congestion  CHEST - 2 VIEW  Comparison: None  Findings: The heart size and mediastinal contours are within normal limits.  Both lungs are clear.  The visualized skeletal structures are unremarkable.  IMPRESSION: No active cardiopulmonary abnormalities.   Original Report Authenticated By: Signa Kell, M.D.    [facility] Wo Cm  09/22/2012  *RADIOLOGY REPORT*  Clinical Data:  Chronic cough, fever.  Rule out sinusitis  CT PARANASAL SINUS LIMITED WITHOUT CONTRAST  Technique:  Multidetector CT images of the paranasal sinuses were  obtained in a single plane without contrast.  Comparison:   None.  Findings:  Paranasal sinuses are clear.  No mucosal edema or air- fluid level is identified.  No bony destruction.  No soft tissue lesion.  IMPRESSION: Negative for sinusitis.   Original Report Authenticated By: Janeece Riggers, M.D.     ASSESSMENT / PLAN:  Severe intractable cough with nml CXR - DD remains upper airway cough most likely with GERD , perhaps contributing  Plan  - Iv morphine 1-2 mg q 3h prn severe cough -Round the clock cough suppression with tid delsym & promethazine- codeine , tramadol  -bid PPI -voice rest  Can discharge once improved hopefully in 24-48h & fu with dr Noah Delaine NP -C    Pulmonary and Critical Care Medicine [facility] Pager: [phone]  09/22/2012, 11:44 AM  I have interviewed and examined the patient and reviewed the database. I have formulated the assessment and plan as reflected in the note above with amendments made by me.   Billy Fischer, MD;  PCCM service; Mobile [phone]

## 2012-09-23 ENCOUNTER — Telehealth: Payer: Self-pay | Admitting: Internal Medicine

## 2012-09-23 MED ORDER — TRAMADOL HCL (ER BIPHASIC) 100 MG PO TB24: ORAL_TABLET | ORAL | Status: DC

## 2012-09-23 MED ORDER — BENZONATATE 200 MG PO CAPS: 200.0000 mg | ORAL_CAPSULE | Freq: Three times a day (TID) | ORAL | Status: DC

## 2012-09-23 MED ORDER — TRAMADOL HCL 50 MG PO TABS: 50.0000 mg | ORAL_TABLET | Freq: Four times a day (QID) | ORAL | Status: DC

## 2012-09-23 NOTE — Progress Notes (Signed)
Pt ready for d/c. Removed PIV, WNL. Went over d/c paperwork with pt. Tramadol prescription was called into the outpatient pharmacy so pt would be unable to pick up this weekend. Pt also stated that she had a prescription for percocet that she had not filled that she did not have and needed a new prescription. Pt also stated that she wanted the prescription for albuterol and valium removed from her d/c summary since they were written in the ED and she did not feel like she still needed them. This RN spoke with Dr. Sherene Sires and he changed her tramadol prescription to be sent to the [facility] in [place]. MD stated that he could not write a new percocet prescription as he would not be back until this evening. MD doubled the tramodol dose to help cover pt's pain over the weekend. MD stated that he would not remove prescriptions that pt requested since they were PRN. Reported this change to Pt. She stated, "I want the prescriptions that I do not need removed from my record. I wish the MD had taken the time to go over my medications with me this morning." Pt signed a note at the bottom of her signature that she did not agree with her med reconciliation. No change in pt condition since this AM assessment. Pt d/c'd to home with father.

## 2012-09-23 NOTE — Telephone Encounter (Signed)
Called in tramadol to outpt pharmacy so changed to [facility] [place].   Does not having more narcotics but reports tramadol works better for cough anyway so rx with tramadol 100 up to q4h prn

## 2012-09-23 NOTE — Discharge Summary (Addendum)
Physician Discharge Summary     Patient ID: Dana Sanders MRN: [id number] DOB/AGE: 10/22/1960 52 y.o.  Admit date: 09/21/2012 Discharge date: 09/23/2012  Admission Diagnoses:  Discharge Diagnoses:  Active Problems:  Cough, persistent   Significant Hospital tests/ studies/ interventions and procedures   Hospital Course:  Admitted with refractory upper airway cough much better p adding tramadol to her regimen and requesting discharge from observation status.  Still occ throat clearing.  Discharge Exam: BP 106/75  Pulse 75  Temp 97.9 F (36.6 C) (Oral)  Resp 16  Ht 5\' 1"  (1.549 m)  Wt 131 lb (59.421 kg)  BMI 24.75 kg/m2  SpO2 99%  Hoarse wf nad on RA Lungs clear bilaterally  Labs at discharge Lab Results  Component Value Date   CREATININE 0.92 09/22/2012   BUN 10 09/22/2012   NA 137 09/22/2012   K 3.8 09/22/2012   CL 101 09/22/2012   CO2 31 09/22/2012   Lab Results  Component Value Date   WBC 6.1 09/22/2012   HGB 12.7 09/22/2012   HCT 36.9 09/22/2012   MCV 87.6 09/22/2012   PLT 243 09/22/2012   No results found for this basename: ALT, AST, GGT, ALKPHOS, BILITOT   No results found for this basename: INR, PROTIME    Current radiology studies [facility]  09/22/2012  *RADIOLOGY REPORT*  Clinical Data:  Chronic cough, fever.  Rule out sinusitis  CT PARANASAL SINUS LIMITED WITHOUT CONTRAST  Technique:  Multidetector CT images of the paranasal sinuses were obtained in a single plane without contrast.  Comparison:   None.  Findings:  Paranasal sinuses are clear.  No mucosal edema or air- fluid level is identified.  No bony destruction.  No soft tissue lesion.  IMPRESSION: Negative for sinusitis.   Original Report Authenticated By: Janeece Riggers, M.D.     Disposition:  01-Home or Self Care     Medication List     As of 09/23/2012  8:15 AM    TAKE these medications         albuterol 108 (90 BASE) MCG/ACT inhaler   Commonly known as: PROVENTIL HFA;VENTOLIN HFA   Inhale 1-2 puffs into the lungs every 6 (six) hours as needed for wheezing.      benzonatate 100 MG capsule   Commonly known as: TESSALON   Take 200 mg by mouth every 6 (six) hours as needed.      cefdinir 300 MG capsule   Commonly known as: OMNICEF   Take 300 mg by mouth 2 (two) times daily. x7 days      DELSYM 30 MG/5ML liquid   Generic drug: dextromethorphan   Take 60 mg by mouth as needed. COUGH      diazepam 5 MG tablet   Commonly known as: VALIUM   Take 1 tablet (5 mg total) by mouth at bedtime as needed for anxiety.      HYDROcodone-homatropine 5-1.5 MG/5ML syrup   Commonly known as: HYCODAN   Take 5 mLs by mouth every 4 (four) hours as needed for cough.      omeprazole 40 MG capsule   Commonly known as: PRILOSEC   Take 40 mg by mouth 2 (two) times daily.      oxyCODONE-acetaminophen 5-325 MG per tablet   Commonly known as: PERCOCET/ROXICET   Take 2 tablets by mouth every 4 (four) hours as needed for pain.      sertraline 100 MG tablet   Commonly known as: ZOLOFT  Take 100 mg by mouth daily.      traMADol 50 MG tablet   Commonly known as: ULTRAM   Take 1 tablet (50 mg total) by mouth every 6 (six) hours.           Discharged Condition: Improved.  Physician Statement:   The Patient was personally examined, the discharge assessment and plan has been personally reviewed and I agree with ACNP Babcock's assessment and plan. > 30 minutes of time have been dedicated to discharge assessment, planning and discharge instructions.   Signed: Sandrea Hughs 09/23/2012, 7:53 AM

## 2012-09-23 NOTE — Progress Notes (Signed)
Dr. Sherene Sires called to inform that there are two [facility] in [place]. Pt had already left. Reported that he called prescription into the one on [address].

## 2012-09-26 ENCOUNTER — Telehealth: Payer: Self-pay | Admitting: *Deleted

## 2012-09-26 NOTE — Telephone Encounter (Signed)
[name] with [employer] states that patient brought in FMLA paperwork 09/25/12 to be filled out and refused to let them fill out the paperwork since there was a fee. Per [name], patient wants Dr Shelle Iron to fill our the paperwork for free. I explained to [name] that Dr Shelle Iron does not fill out this paperwork and that it HAS to be filled out through [employer]. Patient will have to pay the fee or the paperwork cannot be filled out per our protocol d/t [employer] being the ones that gather the records for the records.   Left message on patient home/cell to return our call.

## 2012-09-26 NOTE — Telephone Encounter (Signed)
Will forward back to Ashtyn/Lori to follow up on

## 2012-09-26 NOTE — Telephone Encounter (Signed)
PLEASE HOLD FMLA PAPERS TILL MONDAYS APPT WITH TAMMY P

## 2012-09-27 NOTE — Telephone Encounter (Signed)
FMLA paperwork was given to me by TP and sent down to [employer] per protocol.  Renee w/ [employer] was aware that the paperwork was being sent in an inner-office envelope attn Lynford Humphrey.    Will hold in my in-basket for pt's upcoming ov.

## 2012-09-27 NOTE — Telephone Encounter (Signed)
Will forward to Ned Grace Nurse. Patient has appt with TP 10/02/12 for Hosp F/U and will discuss FMLA paperwork then.   Shanda Bumps please advise. Thanks.

## 2012-09-27 NOTE — Telephone Encounter (Signed)
Dana Sanders, did you receive any FMLA forms on this patient?

## 2012-10-02 ENCOUNTER — Ambulatory Visit (INDEPENDENT_AMBULATORY_CARE_PROVIDER_SITE_OTHER): Payer: 59 | Admitting: Adult Health

## 2012-10-02 ENCOUNTER — Encounter: Payer: Self-pay | Admitting: Adult Health

## 2012-10-02 VITALS — BP 104/62 | HR 84 | Temp 97.3°F | Ht 61.0 in | Wt 126.0 lb

## 2012-10-02 DIAGNOSIS — R05 Cough: Secondary | ICD-10-CM

## 2012-10-02 DIAGNOSIS — R059 Cough, unspecified: Secondary | ICD-10-CM

## 2012-10-02 DIAGNOSIS — R053 Chronic cough: Secondary | ICD-10-CM

## 2012-10-02 MED ORDER — TRAMADOL HCL (ER BIPHASIC) 100 MG PO TB24: ORAL_TABLET | ORAL | Status: DC

## 2012-10-02 MED ORDER — TRAMADOL HCL 50 MG PO TABS: ORAL_TABLET | ORAL | Status: DC

## 2012-10-02 NOTE — Progress Notes (Signed)
Subjective:    Patient ID: Dana Sanders, female    DOB: Jun 17, 1961, 52 y.o.   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  Has been using tessalon perles and delsym without much help  Works in [place] , having rough time w/ coughing during work.  No discolored mucus , abd pain , n/v/d or dyspnea.  Cant stop coughing, keeping her up at night.  Was called in steroid taper by PCP . Started it today >>> Tessalon pearls .Three times a day As needed  Delsym 2 tsp Twice daily For cough  Tussionex 1 tsp Twice daily As needed Cough.  Prilosec 20mg  daily for 3 weeks  Chlorphenarmine (chlor tabs) 4mg  2 tabs .At bedtime For nasal drainage  Zyrec 10mg  daily in am For 2 weeks then As needed Drainage.  Finish Prednisone taper   09/21/2012 She tried above Rx but did not feel better. Required ED visit 1/1 due to  c/o severe CP left side. c/o dry hacking cough, hoarseness, not able to sleep at night. had cxr and EKG at the ED  and both were fine. Chest pain is likely from muscle strain from coughing d/c home with albuterol prn, percocet and valium prn, and motrin round the clock.  She is tearful in the office today & very distressed due tot he cough has not slept in 2 days Remains non productive, no wheeze Reports sore throat at onset >>admitted   10/02/2012 [facility]  Patient returns for a post hospital followup. Patient was admitted 09/21/2012 for refractory cough to outpatient therapy. She was admitted, underwent a CT sinus. It was unremarkable for acute infection. Chest x-ray with no acute process noted. Patient was treated with aggressive cough suppression regimen. Since discharge she is feeling much better  Using Tramadol for cough control . Works if she takes 2 tabs every 6 hrs as  needed.  Did not fill Percocet, valium or proventil at discharge.  Does not feel delsym , tessalon helped.  Did not to take prilosec or chlor tabs are previous recommmended.      Past Medical History  Diagnosis Date  . Depression      Review of Systems Constitutional:   No  weight loss, night sweats,  Fevers, chills, fatigue, or  lassitude.  HEENT:   No headaches,  Difficulty swallowing,  Tooth/dental problems, or  Sore throat,                No sneezing, itching, ear ache, nasal congestion, post nasal drip,   CV:  No chest pain,  Orthopnea, PND, swelling in lower extremities, anasarca, dizziness, palpitations, syncope.   GI  No heartburn, indigestion, abdominal pain, nausea, vomiting, diarrhea, change in bowel habits, loss of appetite, bloody stools.   Resp: No shortness of breath with exertion or at rest.   ,  No coughing up of blood.  No change in color of mucus.  No wheezing.  No chest wall deformity  Skin: no rash or lesions.  GU: no dysuria, change in color of urine, no urgency or frequency.  No flank pain, no hematuria   MS:  No joint pain or swelling.  No decreased range of motion.  No back pain.  Psych:  No change in mood or affect.  No depression or anxiety.  No memory loss.     Objective:   Physical Exam  Gen. Pleasant, well-nourished, in no distress, normal affect ENT - no lesions, no post nasal drip Neck: No JVD, no thyromegaly, no carotid bruits Lungs: no use of accessory muscles, no dullness to percussion, clear without rales or rhonchi  Cardiovascular: Rhythm regular, heart sounds  normal, no murmurs or gallops, no peripheral edema Abdomen: soft and non-tender, no hepatosplenomegaly, BS normal. Musculoskeletal: No deformities, no cyanosis or clubbing Neuro:  alert, non focal        Assessment & Plan:

## 2012-10-02 NOTE — Patient Instructions (Addendum)
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  No yelling, singing, or heavy exertion No throat clearing!!  Keep hard candy in your mouth during day to bathe back of your throat.  No peppermint or cough drops/menthol NO mints.  Tessalon pearls three times a day  As needed   Delsym 2 tsp Twice daily  For cough  Tramadol 50mg   2 Every 4 -6 hrs  As needed  Cough.  Chlorphenarmine (chlor tabs) 4mg  2 tabs .At bedtime  For nasal drainage Begin Prilosec 20mg  daily before meal .  Follow up Dr. Shelle Iron  6 weeks and As needed    Please contact office for sooner follow up if symptoms do not improve or worsen or seek emergency care

## 2012-10-02 NOTE — Progress Notes (Signed)
Ov reviewed, and agree with plan as outlined.  

## 2012-10-02 NOTE — Assessment & Plan Note (Signed)
Cyclical  Cough -slow to resolve flare  Workup unrevealing thus far w/ neg CXR and CT sinus  Advised to work on cough control  Will manage triggers   Plan  Work on minimizing your voice as much as possible.  No yelling, singing, or heavy exertion No throat clearing!!  Keep hard candy in your mouth during day to bathe back of your throat.  No peppermint or cough drops/menthol NO mints.  Tessalon pearls three times a day  As needed   Delsym 2 tsp Twice daily  For cough  Tramadol 50mg   2 Every 4 -6 hrs  As needed  Cough.  Chlorphenarmine (chlor tabs) 4mg  2 tabs .At bedtime  For nasal drainage Begin Prilosec 20mg  daily before meal .  Follow up Dr. Shelle Iron  6 weeks and As needed    Please contact office for sooner follow up if symptoms do not improve or worsen or seek emergency care

## 2012-10-02 NOTE — Addendum Note (Signed)
Addended by: Boone Master E on: 10/02/2012 05:35 PM   Modules accepted: Orders

## 2012-10-03 NOTE — Telephone Encounter (Signed)
Pt seen 1.13.14 by TP for HFU.  [employer] still has her [employer] paperwork.  Pt is aware that per [facility] protocol, this must be handled thru [employer].  Nothing further needed at this time; will sign off.

## 2012-10-09 ENCOUNTER — Telehealth: Payer: Self-pay | Admitting: *Deleted

## 2012-10-09 NOTE — Telephone Encounter (Signed)
FMLA paperwork has been completed and needs your signature.   This has been placed on your desk. Please advise. Thanks.

## 2012-10-18 NOTE — Telephone Encounter (Signed)
This has been completed and given back to [facility]

## 2012-12-06 ENCOUNTER — Other Ambulatory Visit: Payer: Self-pay | Admitting: Obstetrics and Gynecology

## 2012-12-06 DIAGNOSIS — N6489 Other specified disorders of breast: Secondary | ICD-10-CM

## 2012-12-07 ENCOUNTER — Other Ambulatory Visit: Payer: Self-pay | Admitting: Obstetrics and Gynecology

## 2012-12-07 DIAGNOSIS — N6001 Solitary cyst of right breast: Secondary | ICD-10-CM

## 2012-12-07 DIAGNOSIS — Z9882 Breast implant status: Secondary | ICD-10-CM

## 2012-12-12 ENCOUNTER — Ambulatory Visit
Admission: RE | Admit: 2012-12-12 | Discharge: 2012-12-12 | Disposition: A | Discharge status: Home / Self Care | Payer: 59 | Source: Ambulatory Visit | Attending: Obstetrics and Gynecology | Admitting: Obstetrics and Gynecology

## 2012-12-12 DIAGNOSIS — N6001 Solitary cyst of right breast: Secondary | ICD-10-CM

## 2012-12-12 DIAGNOSIS — Z9882 Breast implant status: Secondary | ICD-10-CM

## 2012-12-12 HISTORY — PX: BREAST CYST ASPIRATION: SHX578

## 2012-12-15 ENCOUNTER — Other Ambulatory Visit: Payer: 59

## 2013-01-30 ENCOUNTER — Other Ambulatory Visit: Payer: Self-pay | Admitting: Orthopedic Surgery

## 2013-02-02 ENCOUNTER — Encounter (HOSPITAL_BASED_OUTPATIENT_CLINIC_OR_DEPARTMENT_OTHER): Payer: Self-pay | Admitting: *Deleted

## 2013-02-02 NOTE — Progress Notes (Signed)
Pt works in [employer] has just tested positive tb-getting ready to start meds No ht or resp problems-had a chronic cough-better now

## 2013-02-05 NOTE — H&P (Addendum)
Dana Sanders is an 52 y.o. female.   Chief Complaint: Chronic pain and instability at right index MP joint.  S/P failed secondary repair of index radial collateral ligament 05/17/2012.  Residual pronation and mid range instability despite repair with transosseus kevlar suture and pinning. HPI:  See above.  Inadequate stability and high risk for DJD.  Discussed repeatedly during past 6 months.  Tendon graft reconstruction indicated, strict aftercare rest mandated. No guarantees have been made or implied.  This injury is a work capacity threatening predicament.   Past Medical History  Diagnosis Date  . Depression     Past Surgical History  Procedure Laterality Date  . Shoulder surgery      bilateral  . Septorhinoplasty  1988  . Breast surgery      rt bx  . Total abdominal hysterectomy  1999    Family History  Problem Relation Age of Onset  . Heart disease Father     pacemaker  . Allergies Son   . Rheum arthritis Sister   . Leukemia Daughter    Social History:  reports that she quit smoking about 7 years ago. Her smoking use included Cigarettes. She has a 3 pack-year smoking history. She has never used smokeless tobacco. She reports that she does not use illicit drugs. Her alcohol history is not on file.  Allergies:  Allergies  Allergen Reactions  . Erythromycin Hives  . Other     FOOD ALLERGY: Anaphylactic reaction to MELONS  . Penicillins Hives    No prescriptions prior to admission    No results found for this or any previous visit (from the past 48 hour(s)). No results found.  Review of Systems  Constitutional: Negative.   HENT: Negative.   Eyes: Negative.   Respiratory: Positive for cough and sputum production.   Cardiovascular: Negative.   Gastrointestinal: Negative.   Genitourinary: Negative.   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 Developing pain.  Skin: Negative.   Endo/Heme/Allergies: Negative.    Psychiatric/Behavioral: Positive for depression.    Height 5\' 1"  (1.549 m), weight 57.153 kg (126 lb). Physical Exam  Constitutional: She is oriented to person, place, and time. She appears well-developed and well-nourished.  HENT:  Head: Normocephalic and atraumatic.  Eyes: Conjunctivae and EOM are normal. Pupils are equal, round, and reactive to light.  Neck: Normal range of motion. Neck supple.  Cardiovascular: Normal rate and regular rhythm.   Respiratory: Effort normal and breath sounds normal.  GI: Soft. Bowel sounds are normal.  Musculoskeletal: Normal range of motion.  Laxity of right index radial collateral at 30, 45 and 60 degrees of flexion to ulnar stress. X rays do not reveal narrowing.  Neurological: She is alert and oriented to person, place, and time.  Skin: Skin is warm and dry.  Psychiatric: She has a normal mood and affect. Her behavior is normal. Judgment and thought content normal.     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 S/P secondary repair with trans osseus suture and pin.  Tendon graft reconstruction effort for salvage is indicated.  Residual pain could be problematic.  This is a work capacity threatening predicament.  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 Strict protection of the surgical construct will be essential during the initial 8 weeks following surgery.  Therapy will be necessary.  We cannot guarantee a successful outcome but will offer a" best effort" at reconstruction and rehab. She will experience stiffness and pinch weakness for several months post op and during the early stages of rehabilitation. Questions have been invited and answered in detail. In the holding area prior to any medication, we discussed the "what ifs" of this anticipated surgery. The largest risk is to  find some degenerative arthritis of the metacarpal or proximal phalanx. If arthritis is discovered, pain will likely be problematic unless a fusion of the MP joint is accomplished. Dana Sanders is in agreement with a salvage arthrodesis at 25 degrees of flexion if the joint is arthritic. While, challenging to heal, this should afford strong pinch and substantial pain relief at the expense of motion being sacrificed.  Toney Difatta JR,Patrycja Mumpower V 02/05/2013, 8:43 PM

## 2013-02-06 ENCOUNTER — Ambulatory Visit (HOSPITAL_BASED_OUTPATIENT_CLINIC_OR_DEPARTMENT_OTHER)
Admission: RE | Admit: 2013-02-06 | Discharge: 2013-02-06 | Disposition: A | Discharge status: Home / Self Care | Payer: 59 | Source: Ambulatory Visit | Attending: Orthopedic Surgery | Admitting: Orthopedic Surgery

## 2013-02-06 ENCOUNTER — Encounter (HOSPITAL_BASED_OUTPATIENT_CLINIC_OR_DEPARTMENT_OTHER): Payer: Self-pay | Admitting: Certified Registered Nurse Anesthetist

## 2013-02-06 ENCOUNTER — Encounter (HOSPITAL_BASED_OUTPATIENT_CLINIC_OR_DEPARTMENT_OTHER): Payer: Self-pay | Admitting: *Deleted

## 2013-02-06 ENCOUNTER — Encounter (HOSPITAL_BASED_OUTPATIENT_CLINIC_OR_DEPARTMENT_OTHER)
Admission: RE | Disposition: A | Discharge status: Home / Self Care | Payer: Self-pay | Source: Ambulatory Visit | Attending: Orthopedic Surgery

## 2013-02-06 ENCOUNTER — Ambulatory Visit (HOSPITAL_BASED_OUTPATIENT_CLINIC_OR_DEPARTMENT_OTHER): Payer: 59 | Admitting: Certified Registered Nurse Anesthetist

## 2013-02-06 DIAGNOSIS — Z91018 Allergy to other foods: Secondary | ICD-10-CM | POA: Insufficient documentation

## 2013-02-06 DIAGNOSIS — Z880 Allergy status to penicillin: Secondary | ICD-10-CM | POA: Insufficient documentation

## 2013-02-06 DIAGNOSIS — M249 Joint derangement, unspecified: Secondary | ICD-10-CM | POA: Insufficient documentation

## 2013-02-06 DIAGNOSIS — Z87891 Personal history of nicotine dependence: Secondary | ICD-10-CM | POA: Insufficient documentation

## 2013-02-06 DIAGNOSIS — M2420 Disorder of ligament, unspecified site: Secondary | ICD-10-CM | POA: Insufficient documentation

## 2013-02-06 DIAGNOSIS — M9420 Chondromalacia, unspecified site: Secondary | ICD-10-CM | POA: Insufficient documentation

## 2013-02-06 DIAGNOSIS — Z881 Allergy status to other antibiotic agents status: Secondary | ICD-10-CM | POA: Insufficient documentation

## 2013-02-06 HISTORY — PX: FINGER ARTHRODESIS: SHX5000

## 2013-02-06 LAB — POCT HEMOGLOBIN-HEMACUE: Hemoglobin: 13 g/dL (ref 12.0–15.0)

## 2013-02-06 SURGERY — FUSION, JOINT, FINGER
Anesthesia: General | Site: Finger | Laterality: Right | Wound class: Clean

## 2013-02-06 MED ORDER — FENTANYL CITRATE 0.05 MG/ML IJ SOLN: 50.0000 ug | INTRAMUSCULAR | Status: DC | PRN

## 2013-02-06 MED ORDER — OXYCODONE HCL 5 MG/5ML PO SOLN: 5.0000 mg | Freq: Once | ORAL | Status: AC | PRN

## 2013-02-06 MED ORDER — FENTANYL CITRATE 0.05 MG/ML IJ SOLN
INTRAMUSCULAR | Status: DC | PRN
  Administered 2013-02-06: 25 ug via INTRAVENOUS
  Administered 2013-02-06: 100 ug via INTRAVENOUS
  Administered 2013-02-06: 25 ug via INTRAVENOUS

## 2013-02-06 MED ORDER — HYDROMORPHONE HCL PF 1 MG/ML IJ SOLN
0.2500 mg | INTRAMUSCULAR | Status: DC | PRN
  Administered 2013-02-06 (×4): 0.5 mg via INTRAVENOUS

## 2013-02-06 MED ORDER — DOXYCYCLINE HYCLATE 50 MG PO CAPS: 100.0000 mg | ORAL_CAPSULE | Freq: Two times a day (BID) | ORAL | Status: DC

## 2013-02-06 MED ORDER — ROPIVACAINE HCL 5 MG/ML IJ SOLN
INTRAMUSCULAR | Status: DC | PRN
  Administered 2013-02-06: 3.5 mL

## 2013-02-06 MED ORDER — LIDOCAINE HCL (CARDIAC) 20 MG/ML IV SOLN
INTRAVENOUS | Status: DC | PRN
  Administered 2013-02-06: 60 mg via INTRAVENOUS

## 2013-02-06 MED ORDER — OXYCODONE-ACETAMINOPHEN 5-325 MG PO TABS: ORAL_TABLET | ORAL | Status: DC

## 2013-02-06 MED ORDER — CHLORHEXIDINE GLUCONATE 4 % EX LIQD: 60.0000 mL | Freq: Once | CUTANEOUS | Status: DC

## 2013-02-06 MED ORDER — ONDANSETRON HCL 4 MG/2ML IJ SOLN: 4.0000 mg | Freq: Once | INTRAMUSCULAR | Status: DC | PRN

## 2013-02-06 MED ORDER — LACTATED RINGERS IV SOLN
INTRAVENOUS | Status: DC
  Administered 2013-02-06 (×2): via INTRAVENOUS

## 2013-02-06 MED ORDER — DEXAMETHASONE SODIUM PHOSPHATE 10 MG/ML IJ SOLN
INTRAMUSCULAR | Status: DC | PRN
  Administered 2013-02-06: 10 mg via INTRAVENOUS

## 2013-02-06 MED ORDER — ONDANSETRON HCL 4 MG/2ML IJ SOLN
INTRAMUSCULAR | Status: DC | PRN
  Administered 2013-02-06: 4 mg via INTRAVENOUS

## 2013-02-06 MED ORDER — MIDAZOLAM HCL 5 MG/5ML IJ SOLN
INTRAMUSCULAR | Status: DC | PRN
  Administered 2013-02-06: 2 mg via INTRAVENOUS

## 2013-02-06 MED ORDER — HYDROMORPHONE HCL PF 1 MG/ML IJ SOLN
0.5000 mg | INTRAMUSCULAR | Status: DC | PRN
  Administered 2013-02-06: 0.5 mg via INTRAVENOUS

## 2013-02-06 MED ORDER — KETOROLAC TROMETHAMINE 30 MG/ML IJ SOLN
30.0000 mg | Freq: Once | INTRAMUSCULAR | Status: AC
  Administered 2013-02-06: 30 mg via INTRAVENOUS

## 2013-02-06 MED ORDER — OXYCODONE HCL 5 MG PO TABS
5.0000 mg | ORAL_TABLET | Freq: Once | ORAL | Status: AC | PRN
  Administered 2013-02-06: 5 mg via ORAL

## 2013-02-06 MED ORDER — PROPOFOL 10 MG/ML IV BOLUS
INTRAVENOUS | Status: DC | PRN
  Administered 2013-02-06: 200 mg via INTRAVENOUS

## 2013-02-06 MED ORDER — VANCOMYCIN HCL IN DEXTROSE 1-5 GM/200ML-% IV SOLN
1000.0000 mg | INTRAVENOUS | Status: AC
  Administered 2013-02-06: 1000 mg via INTRAVENOUS

## 2013-02-06 MED ORDER — MIDAZOLAM HCL 2 MG/2ML IJ SOLN: 1.0000 mg | INTRAMUSCULAR | Status: DC | PRN

## 2013-02-06 SURGICAL SUPPLY — 87 items
ARTHREX ×4 IMPLANT
BANDAGE ADHESIVE 1X3 (GAUZE/BANDAGES/DRESSINGS) IMPLANT
BANDAGE ELASTIC 3 VELCRO ST LF (GAUZE/BANDAGES/DRESSINGS) ×5 IMPLANT
BANDAGE ELASTIC 4 VELCRO ST LF (GAUZE/BANDAGES/DRESSINGS) IMPLANT
BANDAGE GAUZE ELAST BULKY 4 IN (GAUZE/BANDAGES/DRESSINGS) ×6 IMPLANT
BLADE MINI RND TIP GREEN BEAV (BLADE) ×2 IMPLANT
BLADE SURG 15 STRL LF DISP TIS (BLADE) ×2 IMPLANT
BLADE SURG 15 STRL SS (BLADE) ×3
BNDG CMPR 9X4 STRL LF SNTH (GAUZE/BANDAGES/DRESSINGS) ×2
BNDG CMPR MD 5X2 ELC HKLP STRL (GAUZE/BANDAGES/DRESSINGS) ×2
BNDG ELASTIC 2 VLCR STRL LF (GAUZE/BANDAGES/DRESSINGS) ×2 IMPLANT
BNDG ESMARK 4X9 LF (GAUZE/BANDAGES/DRESSINGS) ×2 IMPLANT
BRUSH SCRUB EZ PLAIN DRY (MISCELLANEOUS) ×3 IMPLANT
BUR EGG 3PK/BX (BURR) ×2 IMPLANT
CANISTER SUCTION 1200CC (MISCELLANEOUS) ×3 IMPLANT
CATH ROBINSON RED A/P 10FR (CATHETERS) IMPLANT
CATH ROBINSON RED A/P 12FR (CATHETERS) IMPLANT
CLOTH BEACON ORANGE TIMEOUT ST (SAFETY) ×3 IMPLANT
CORDS BIPOLAR (ELECTRODE) ×3 IMPLANT
COVER MAYO STAND STRL (DRAPES) ×3 IMPLANT
COVER TABLE BACK 60X90 (DRAPES) ×3 IMPLANT
CUFF TOURNIQUET SINGLE 18IN (TOURNIQUET CUFF) ×2 IMPLANT
DECANTER SPIKE VIAL GLASS SM (MISCELLANEOUS) IMPLANT
DRAPE EXTREMITY T 121X128X90 (DRAPE) ×3 IMPLANT
DRAPE OEC MINIVIEW 54X84 (DRAPES) ×3 IMPLANT
DRAPE SURG 17X23 STRL (DRAPES) ×3 IMPLANT
GLOVE BIOGEL M STRL SZ7.5 (GLOVE) ×3 IMPLANT
GLOVE BIOGEL PI IND STRL 7.0 (GLOVE) ×1 IMPLANT
GLOVE BIOGEL PI IND STRL 8 (GLOVE) ×1 IMPLANT
GLOVE BIOGEL PI INDICATOR 7.0 (GLOVE) ×1
GLOVE BIOGEL PI INDICATOR 8 (GLOVE) ×1
GLOVE ECLIPSE 6.5 STRL STRAW (GLOVE) ×4 IMPLANT
GLOVE ORTHO TXT STRL SZ7.5 (GLOVE) ×3 IMPLANT
GOWN BRE IMP PREV XXLGXLNG (GOWN DISPOSABLE) ×6 IMPLANT
GOWN PREVENTION PLUS XLARGE (GOWN DISPOSABLE) ×1 IMPLANT
K-WIRE .035X4 (WIRE) ×4 IMPLANT
K-WIRE .045X4 (WIRE) ×2 IMPLANT
NDL ADDISON D1/2 CIR (NEEDLE) IMPLANT
NDL KEITH (NEEDLE) IMPLANT
NDL KEITH SZ10 STRAIGHT (NEEDLE) IMPLANT
NDL SAFETY ECLIPSE 18X1.5 (NEEDLE) ×1 IMPLANT
NEEDLE 27GAX1X1/2 (NEEDLE) ×2 IMPLANT
NEEDLE ADDISON D1/2 CIR (NEEDLE) IMPLANT
NEEDLE HYPO 18GX1.5 SHARP (NEEDLE) ×3
NEEDLE KEITH (NEEDLE) IMPLANT
NEEDLE KEITH SZ10 STRAIGHT (NEEDLE) IMPLANT
NS IRRIG 1000ML POUR BTL (IV SOLUTION) ×3 IMPLANT
PACK BASIN DAY SURGERY FS (CUSTOM PROCEDURE TRAY) ×3 IMPLANT
PAD CAST 3X4 CTTN HI CHSV (CAST SUPPLIES) ×2 IMPLANT
PAD CAST 4YDX4 CTTN HI CHSV (CAST SUPPLIES) IMPLANT
PADDING CAST ABS 4INX4YD NS (CAST SUPPLIES) ×1
PADDING CAST ABS COTTON 4X4 ST (CAST SUPPLIES) ×2 IMPLANT
PADDING CAST COTTON 3X4 STRL (CAST SUPPLIES) ×3
PADDING CAST COTTON 4X4 STRL (CAST SUPPLIES)
PADDING UNDERCAST 2  STERILE (CAST SUPPLIES) ×2 IMPLANT
PASSER SUT SWANSON 36MM LOOP (INSTRUMENTS) IMPLANT
SCREW COMPRESSION 2.5X24 (Screw) IMPLANT
SCREW COMPRESSION 2.5X25 (Screw) ×3 IMPLANT
SCREW COMPRESSION 2.5X28 (Screw) ×2 IMPLANT
SLEEVE SCD COMPRESS KNEE MED (MISCELLANEOUS) ×2 IMPLANT
SPLINT PLASTER CAST XFAST 3X15 (CAST SUPPLIES) ×20 IMPLANT
SPLINT PLASTER XTRA FASTSET 3X (CAST SUPPLIES) ×20
SPONGE GAUZE 4X4 12PLY (GAUZE/BANDAGES/DRESSINGS) ×3 IMPLANT
STOCKINETTE 4X48 STRL (DRAPES) ×3 IMPLANT
STRIP CLOSURE SKIN 1/2X4 (GAUZE/BANDAGES/DRESSINGS) ×2 IMPLANT
SUCTION FRAZIER TIP 10 FR DISP (SUCTIONS) IMPLANT
SUT ETHIBOND 3-0 V-5 (SUTURE) IMPLANT
SUT ETHILON 4 0 PS 2 18 (SUTURE) IMPLANT
SUT FIBERWIRE 3-0 18 TAPR NDL (SUTURE)
SUT MERSILENE 4 0 P 3 (SUTURE) ×2 IMPLANT
SUT MERSILENE 6 0 P 1 (SUTURE) IMPLANT
SUT PLAIN 5 0 P 3 18 (SUTURE) ×2 IMPLANT
SUT POLY BUTTON 15MM (SUTURE) IMPLANT
SUT PROLENE 3 0 PS 2 (SUTURE) IMPLANT
SUT PROLENE 4 0 P 3 18 (SUTURE) IMPLANT
SUT STEEL 0 (SUTURE) ×3
SUT STEEL 0 18XMFL TIE 17 (SUTURE) ×1 IMPLANT
SUT VIC AB 4-0 P-3 18XBRD (SUTURE) IMPLANT
SUT VIC AB 4-0 P3 18 (SUTURE)
SUTURE FIBERWR 3-0 18 TAPR NDL (SUTURE) IMPLANT
SYR 3ML 23GX1 SAFETY (SYRINGE) IMPLANT
SYR BULB 3OZ (MISCELLANEOUS) ×3 IMPLANT
SYR CONTROL 10ML LL (SYRINGE) ×2 IMPLANT
TOWEL OR 17X24 6PK STRL BLUE (TOWEL DISPOSABLE) ×3 IMPLANT
TRAY DSU PREP LF (CUSTOM PROCEDURE TRAY) ×3 IMPLANT
TUBE CONNECTING 20X1/4 (TUBING) ×2 IMPLANT
UNDERPAD 30X30 INCONTINENT (UNDERPADS AND DIAPERS) ×3 IMPLANT

## 2013-02-06 NOTE — Brief Op Note (Signed)
02/06/2013  12:58 PM  PATIENT:  Idell Pickles  52 y.o. 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   PROCEDURE:  Exploration and arthrodesis of right index metacarpal phalangeal joint with arthrex micro compression screws and dorsal tensionband  SURGEON:  Surgeon(s) and Role:    * Wyn Forster., MD - Primary  PHYSICIAN ASSISTANT:   ASSISTANTS: scrubbed registered nurse   ANESTHESIA:   general  EBL:  Total I/O In: 1800 [I.V.:1800] Out: -   BLOOD ADMINISTERED:none  DRAINS: none   LOCAL MEDICATIONS USED: ropivacaine field block  SPECIMEN:  No Specimen  DISPOSITION OF SPECIMEN:  N/A  COUNTS:  YES  TOURNIQUET:   Total Tourniquet Time Documented: Upper Arm (Right) - 124 minutes Total: Upper Arm (Right) - 124 minutes   DICTATION: .Other Dictation: Dictation Number [id number]  PLAN OF CARE: Discharge to home after PACU  PATIENT DISPOSITION:  PACU - hemodynamically stable.   Delay start of Pharmacological VTE agent (>24hrs) due to surgical blood loss or risk of bleeding: not applicable

## 2013-02-06 NOTE — Anesthesia Preprocedure Evaluation (Addendum)
Anesthesia Evaluation  Patient identified by MRN, date of birth, ID band Patient awake    Reviewed: Allergy & Precautions, H&P , NPO status , Patient's Chart, lab work & pertinent test results  Airway Mallampati: I TM Distance: >3 FB Neck ROM: Full    Dental  (+) Teeth Intact and Dental Advisory Given   Pulmonary  breath sounds clear to auscultation        Cardiovascular Rhythm:Regular Rate:Normal     Neuro/Psych PSYCHIATRIC DISORDERS Depression    GI/Hepatic   Endo/Other    Renal/GU      Musculoskeletal   Abdominal   Peds  Hematology   Anesthesia Other Findings   Reproductive/Obstetrics                          Anesthesia Physical Anesthesia Plan  ASA: I  Anesthesia Plan: General   Post-op Pain Management:    Induction: Intravenous  Airway Management Planned: LMA  Additional Equipment:   Intra-op Plan:   Post-operative Plan: Extubation in OR  Informed Consent: I have reviewed the patients History and Physical, chart, labs and discussed the procedure including the risks, benefits and alternatives for the proposed anesthesia with the patient or authorized representative who has indicated his/her understanding and acceptance.   Dental advisory given  Plan Discussed with: CRNA, Anesthesiologist and Surgeon  Anesthesia Plan Comments:         Anesthesia Quick Evaluation

## 2013-02-06 NOTE — Transfer of Care (Signed)
Immediate Anesthesia Transfer of Care Note  Patient: Dana Sanders  Procedure(s) Performed: Procedure(s) with comments: ARTHRODESIS FINGER (Right) - Right Index Metacarpal phalangeal jOint Arthrodesis  Patient Location: PACU  Anesthesia Type:General  Level of Consciousness: awake, alert , oriented and patient cooperative  Airway & Oxygen Therapy: Patient Spontanous Breathing and Patient connected to face mask oxygen  Post-op Assessment: Report given to PACU RN and Post -op Vital signs reviewed and stable  Post vital signs: Reviewed and stable  Complications: No apparent anesthesia complications

## 2013-02-06 NOTE — Anesthesia Procedure Notes (Signed)
Procedure Name: LMA Insertion Date/Time: 02/06/2013 10:24 AM Performed by: Shamarie Call D Pre-anesthesia Checklist: Patient identified, Emergency Drugs available, Suction available and Patient being monitored Patient Re-evaluated:Patient Re-evaluated prior to inductionOxygen Delivery Method: Circle System Utilized Preoxygenation: Pre-oxygenation with 100% oxygen Intubation Type: IV induction Ventilation: Mask ventilation without difficulty LMA: LMA inserted LMA Size: 4.0 Number of attempts: 1 Airway Equipment and Method: bite block Placement Confirmation: positive ETCO2 Tube secured with: Tape Dental Injury: Teeth and Oropharynx as per pre-operative assessment

## 2013-02-06 NOTE — Anesthesia Postprocedure Evaluation (Signed)
  Anesthesia Post-op Note  Patient: Dana Sanders  Procedure(s) Performed: Procedure(s) with comments: ARTHRODESIS FINGER (Right) - Right Index Metacarpal phalangeal jOint Arthrodesis  Patient Location: PACU  Anesthesia Type:General  Level of Consciousness: awake, alert  and oriented  Airway and Oxygen Therapy: Patient Spontanous Breathing  Post-op Pain: moderate  Post-op Assessment: Post-op Vital signs reviewed  Post-op Vital Signs: Reviewed  Complications: No apparent anesthesia complications

## 2013-02-06 NOTE — Op Note (Signed)
[id number] 

## 2013-02-07 ENCOUNTER — Encounter (HOSPITAL_BASED_OUTPATIENT_CLINIC_OR_DEPARTMENT_OTHER): Payer: Self-pay | Admitting: Orthopedic Surgery

## 2013-02-07 NOTE — Op Note (Addendum)
[name], Sanders           ACCOUNT NO.:  [id number]  MEDICAL RECORD NO.:  [id number]  LOCATION:                                 FACILITY:  PHYSICIAN:  Dana Fitch. Jezlyn Westerfield, M.D. DATE OF BIRTH:  1960/11/30  DATE OF PROCEDURE:  02/06/2013 DATE OF DISCHARGE:                              OPERATIVE REPORT   PREOPERATIVE DIAGNOSIS:  Status post traumatic rupture of right index finger, radial collateral ligament at metacarpal phalangeal joint with subsequent development of disabling instability and discomfort, status post secondary repair of ligament with transosseous suture technique performed in August 2012 followed by subsequent repetitive trauma and loss of adequate stability of superficial fibers of radial collateral ligament construct.  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  POSTOPERATIVE DIAGNOSIS:  Status post traumatic rupture of right index finger, radial collateral ligament at metacarpal phalangeal joint with subsequent development of disabling instability and discomfort, status post secondary repair of ligament with transosseous suture technique performed in August 2012 followed by subsequent repetitive trauma and loss of adequate stability of superficial fibers of radial collateral ligament construct. 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  OPERATION: 1. Exploration of failed radial collateral ligament reconstruction,     right index finger metacarpophalangeal joint with identification of     chondromalacia and intact repair at metacarpal head and laxity     distally. 2. Arthrodesis of right index metacarpophalangeal  joint at 25     degrees flexion and slight supination and neutral deviation with     Arthrex microcompression screws x2 and a dorsal 26-gauge double     stranded tension band.  OPERATING SURGEON:  Dana Fitch. Neven Fina, MD.  ASSISTANT:  Scrub nurse.  ANESTHESIA:  General by LMA.  SUPERVISING ANESTHESIOLOGIST:  Dr. Ivin Booty.  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  She presented for number of hand surgeon opinions, initially Dr. Merlyn Lot, who felt that she had a sprain, subsequently by myself who identified a grade 3 tear of the radial collateral ligament.  We had detailed informed consent in the summer of 2012, and I recommended an attempt at secondary repair or ligament reconstruction at that time.  In August of 2012, her hand was explored, and we identified a proximal avulsion off the metacarpal head.  We corrected the position of her finger with respect to pronation and subluxation of the joint and identified intact hyaline articular cartilage surfaces at that time.  We performed a reconstruction of the radial collateral ligament utilizing a Kirschner wire fixating the MP joint.  In the early postoperative period, we had a very smooth course, however, in the mid postoperative course 3-4  weeks out, we began to have significant problems with the pin, ultimately removing the pin earlier than planned.  We never had signs of clear sepsis of the pin, but there clearly was erythema, and there was always the risk that there was low grade infection of the pin tract.  In the subsequent postoperative period, Dana Sanders was splinted appropriately and upon return to work began to  experience the gradual onset of laxity of the radial collateral ligament reconstruction.  By the winter of 2013, she had instability at 45 and 60 degrees of flexion, but it was still stable in 90 degrees of flexion.  In mid flexion, she was painful.  X-rays did not reveal significant narrowing of the joint.  After lengthy period of rehabilitation in attempts to ameliorate her pain, we returned to the operating at this time anticipating either a reconstruction of the radial collateral ligament with a free palmaris longus tendon graft or arthrodesis of the metacarpophalangeal joint.  Given the possibility of prior pin tract infection, implant arthroplasty has not been considered at this time and given her very significant pinch demands at work, placing a latex glove on surgeon's hands and peel packing as well as handling instruments with vigor and pinch prehension in my judgment arthrodesis is the correct choice for salvage.  Preoperatively, these issues have been discussed in detail in the office and in the holding area.  Dana Sanders proceed at this time after questions had been invited and answered in detail.  PROCEDURE IN DETAIL:  Dana Sanders was interviewed by Dr. Ivin Booty of Anesthesia and accepted general anesthesia by LMA technique.  She was transferred to room 6 of [facility] and placed supine position on the operating table.  Following induction of general anesthesia, under Dr. Ivin Booty' direct supervision, the right arm and hand were prepped with Betadine soap and solution, sterilely draped.  A 1 g of vancomycin was administered as an IV prophylactic antibiotic.  The hand was identified by the proper Surgical site protocol in the holding area, and we subsequently performed a routine surgical time-out.  After exsanguination of the right hand and arm with an Esmarch bandage, an arterial tourniquet on the proximal brachium was inflated 220 mmHg.  The procedure  commenced with resection of prior surgical scar and release of scar between the dermis and the extensor mechanism.  The extensor mechanism was split in the midline, and the capsule of the MP joint was identified.  We were able to easily identify the site of the prior repair, and we noted that the repair was found intact on the metacarpal head. The Kevlar suture that was placed transosseously was intact.  There was, however, laxity under anesthesia, particularly at mid ranges of flexion of 30 and 60 degrees with total stability at 90 degrees of flexion.  It appeared that a portion of the distal ligament had stretched out.  I carefully examined the articular surfaces of the metacarpal head and proximal phalangeal base.  More than half of the proximal phalangeal base had grade 3 or worse chondromalacia, and there was an area of fibrocartilage at the site of the pin tract.  The pin tract site was of larger caliber than pin previously placed leading me to believe that there may have been previous sepsis.  The metacarpal head pin tract had healed, but there was chondromalacia of grade 3 or worse on more than 1/3rd of the articular surface.  I asked my associate, Dr. Merlyn Lot to take a look at  the joint and question whether or not he would ever consider a ligament reconstruction under these circumstances.  He concurred that arthrodesis or implant arthroplasty would be appropriate.  Given the aforementioned issues with a prior pin tract possibly being infected and due to job demands, I personally would not choose the arthroplasty.  We then proceeded to perform a cup and cone type arthrodesis in the manner of Noralyn Pick using a power bur to create a cup at the proximal Phalanx base and a cone/bullet shape of the metacarpal head.  The marginal osteophytes were removed and cancellous bone exposed.  We then carefully placed a double-stranded 26-gauge wire with figure-of- eight technique to compress  the joint surfaces and place with retrograde technique two 0.035-inch Kirschner wires, maintaining a position of 25 degrees flexion of the MP joint, slight supination to facilitate pinch and neutral deviation.  This would place the finger in slight abduction and extension, however, there will be no scissoring in flexion in this position, and this should allow excellent use of lateral pinch prehension postoperatively.  A pair of titanium Arthrex microscrews were placed with standard technique, one 28 mm in length and the second 24 mm in length.  The tension band was tightened with excellent compression of the joint surfaces.  The wound was thoroughly lavaged with sterile saline followed by repair of the capsule with interrupted suture, figure-of-eight style of 4-0 Mersilene followed by repair of the extensor mechanism with figure-of- eight sutures of 4-0 Mersilene and finishing suture of 4-0 Mersilene with knots buried.  The skin was repaired with subcutaneous 5-0 plain gut due to an intolerance to Vicryl noted preoperatively.  The skin was repaired with segmental intradermal 3-0 Prolene and Steri-Strips.  The 0.5% ropivacaine was infiltrated along the wound margins around the MP joint capsule for postoperative analgesia.  The wound was dressed with sterile gauze, sterile Kerlix, sterile Webril, and a dorsal and palmar plaster sandwich splint maintaining the hand in safe position.  There were no apparent complications.     Dana Sanders, M.D.     RVS/MEDQ  D:  02/06/2013  T:  02/07/2013  Job:  [id number]

## 2013-07-26 ENCOUNTER — Other Ambulatory Visit: Payer: Self-pay

## 2013-12-11 ENCOUNTER — Other Ambulatory Visit: Payer: Self-pay

## 2013-12-11 DIAGNOSIS — Z1231 Encounter for screening mammogram for malignant neoplasm of breast: Secondary | ICD-10-CM

## 2013-12-24 ENCOUNTER — Ambulatory Visit
Admission: RE | Admit: 2013-12-24 | Discharge: 2013-12-24 | Disposition: A | Discharge status: Home / Self Care | Payer: 59 | Source: Ambulatory Visit

## 2013-12-24 DIAGNOSIS — Z1231 Encounter for screening mammogram for malignant neoplasm of breast: Secondary | ICD-10-CM

## 2014-02-25 ENCOUNTER — Other Ambulatory Visit (HOSPITAL_COMMUNITY): Payer: Self-pay | Admitting: Orthopedic Surgery

## 2014-02-25 DIAGNOSIS — D179 Benign lipomatous neoplasm, unspecified: Secondary | ICD-10-CM

## 2014-03-15 ENCOUNTER — Ambulatory Visit (HOSPITAL_COMMUNITY)
Admission: RE | Admit: 2014-03-15 | Discharge: 2014-03-15 | Disposition: A | Discharge status: Home / Self Care | Payer: 59 | Source: Ambulatory Visit | Attending: Orthopedic Surgery | Admitting: Orthopedic Surgery

## 2014-03-15 DIAGNOSIS — D179 Benign lipomatous neoplasm, unspecified: Secondary | ICD-10-CM | POA: Insufficient documentation

## 2014-03-15 MED ORDER — GADOBENATE DIMEGLUMINE 529 MG/ML IV SOLN
13.0000 mL | Freq: Once | INTRAVENOUS | Status: AC
  Administered 2014-03-15: 13 mL via INTRAVENOUS

## 2014-07-19 ENCOUNTER — Other Ambulatory Visit: Payer: Self-pay | Admitting: Internal Medicine

## 2014-07-19 ENCOUNTER — Telehealth: Payer: Self-pay | Admitting: Pulmonary Disease

## 2014-07-19 ENCOUNTER — Ambulatory Visit
Admission: RE | Admit: 2014-07-19 | Discharge: 2014-07-19 | Disposition: A | Discharge status: Home / Self Care | Payer: 59 | Source: Ambulatory Visit | Attending: Internal Medicine | Admitting: Internal Medicine

## 2014-07-19 DIAGNOSIS — R0789 Other chest pain: Secondary | ICD-10-CM

## 2014-07-19 NOTE — Telephone Encounter (Signed)
Spoke with the pt  She is c/o cough for the past month- now has rib pain from cough  OV with TP for Monday 07/22/14 at 4:30  I advised seek emergent care sooner if needed

## 2014-07-22 ENCOUNTER — Emergency Department (HOSPITAL_COMMUNITY): Payer: 59

## 2014-07-22 ENCOUNTER — Emergency Department (HOSPITAL_COMMUNITY)
Admission: EM | Admit: 2014-07-22 | Discharge: 2014-07-22 | Disposition: A | Discharge status: Home / Self Care | Payer: 59 | Attending: Emergency Medicine | Admitting: Emergency Medicine

## 2014-07-22 ENCOUNTER — Encounter (HOSPITAL_COMMUNITY): Payer: Self-pay | Admitting: Emergency Medicine

## 2014-07-22 ENCOUNTER — Ambulatory Visit: Payer: 59 | Admitting: Adult Health

## 2014-07-22 DIAGNOSIS — R1032 Left lower quadrant pain: Secondary | ICD-10-CM | POA: Diagnosis present

## 2014-07-22 DIAGNOSIS — Z87891 Personal history of nicotine dependence: Secondary | ICD-10-CM | POA: Insufficient documentation

## 2014-07-22 DIAGNOSIS — R05 Cough: Secondary | ICD-10-CM | POA: Insufficient documentation

## 2014-07-22 DIAGNOSIS — Z792 Long term (current) use of antibiotics: Secondary | ICD-10-CM | POA: Insufficient documentation

## 2014-07-22 DIAGNOSIS — R112 Nausea with vomiting, unspecified: Secondary | ICD-10-CM

## 2014-07-22 DIAGNOSIS — Z79899 Other long term (current) drug therapy: Secondary | ICD-10-CM | POA: Insufficient documentation

## 2014-07-22 DIAGNOSIS — F329 Major depressive disorder, single episode, unspecified: Secondary | ICD-10-CM | POA: Insufficient documentation

## 2014-07-22 DIAGNOSIS — Z880 Allergy status to penicillin: Secondary | ICD-10-CM | POA: Insufficient documentation

## 2014-07-22 DIAGNOSIS — N200 Calculus of kidney: Secondary | ICD-10-CM | POA: Diagnosis not present

## 2014-07-22 LAB — COMPREHENSIVE METABOLIC PANEL
ALBUMIN: 3.9 g/dL (ref 3.5–5.2)
ALK PHOS: 59 U/L (ref 39–117)
ALT: 14 U/L (ref 0–35)
ANION GAP: 16 — AB (ref 5–15)
AST: 16 U/L (ref 0–37)
BILIRUBIN TOTAL: 0.3 mg/dL (ref 0.3–1.2)
BUN: 19 mg/dL (ref 6–23)
CHLORIDE: 103 meq/L (ref 96–112)
CO2: 24 mEq/L (ref 19–32)
Calcium: 9.7 mg/dL (ref 8.4–10.5)
Creatinine, Ser: 0.84 mg/dL (ref 0.50–1.10)
GFR calc Af Amer: >90 mL/min (ref >90)
GFR calc non Af Amer: 79 mL/min — ABNORMAL LOW (ref >90)
Glucose, Bld: 120 mg/dL — ABNORMAL HIGH (ref 70–99)
POTASSIUM: 4 meq/L (ref 3.7–5.3)
Sodium: 143 mEq/L (ref 137–147)
Total Protein: 7.3 g/dL (ref 6.0–8.3)

## 2014-07-22 LAB — URINE MICROSCOPIC-ADD ON

## 2014-07-22 LAB — CBC WITH DIFFERENTIAL/PLATELET
BASOS PCT: 0 % (ref 0–1)
Basophils Absolute: 0 10*3/uL (ref 0.0–0.1)
Eosinophils Absolute: 0.4 10*3/uL (ref 0.0–0.7)
Eosinophils Relative: 3 % (ref 0–5)
HCT: 37.8 % (ref 36.0–46.0)
HEMOGLOBIN: 13 g/dL (ref 12.0–15.0)
LYMPHS ABS: 2.2 10*3/uL (ref 0.7–4.0)
Lymphocytes Relative: 20 % (ref 12–46)
MCH: 29.7 pg (ref 26.0–34.0)
MCHC: 34.4 g/dL (ref 30.0–36.0)
MCV: 86.5 fL (ref 78.0–100.0)
MONOS PCT: 8 % (ref 3–12)
Monocytes Absolute: 0.8 10*3/uL (ref 0.1–1.0)
NEUTROS ABS: 7.7 10*3/uL (ref 1.7–7.7)
NEUTROS PCT: 69 % (ref 43–77)
Platelets: 266 10*3/uL (ref 150–400)
RBC: 4.37 MIL/uL (ref 3.87–5.11)
RDW: 12.3 % (ref 11.5–15.5)
WBC: 11.2 10*3/uL — AB (ref 4.0–10.5)

## 2014-07-22 LAB — URINALYSIS, ROUTINE W REFLEX MICROSCOPIC
BILIRUBIN URINE: NEGATIVE
GLUCOSE, UA: NEGATIVE mg/dL
KETONES UR: 40 mg/dL — AB
NITRITE: NEGATIVE
PH: 5.5 (ref 5.0–8.0)
Protein, ur: 30 mg/dL — AB
Specific Gravity, Urine: 1.021 (ref 1.005–1.030)
Urobilinogen, UA: 0.2 mg/dL (ref 0.0–1.0)

## 2014-07-22 MED ORDER — MORPHINE SULFATE 4 MG/ML IJ SOLN
4.0000 mg | Freq: Once | INTRAMUSCULAR | Status: AC
  Administered 2014-07-22: 4 mg via INTRAVENOUS
  Filled 2014-07-22: qty 1

## 2014-07-22 MED ORDER — FENTANYL CITRATE 0.05 MG/ML IJ SOLN
50.0000 ug | Freq: Once | INTRAMUSCULAR | Status: AC
  Administered 2014-07-22: 50 ug via INTRAVENOUS
  Filled 2014-07-22: qty 2

## 2014-07-22 MED ORDER — SODIUM CHLORIDE 0.9 % IV BOLUS (SEPSIS)
1000.0000 mL | Freq: Once | INTRAVENOUS | Status: AC
  Administered 2014-07-22: 1000 mL via INTRAVENOUS

## 2014-07-22 MED ORDER — ONDANSETRON HCL 4 MG/2ML IJ SOLN
4.0000 mg | INTRAMUSCULAR | Status: DC | PRN
  Administered 2014-07-22 (×2): 4 mg via INTRAVENOUS
  Filled 2014-07-22 (×2): qty 2

## 2014-07-22 MED ORDER — ONDANSETRON 4 MG PO TBDP: 4.0000 mg | ORAL_TABLET | Freq: Three times a day (TID) | ORAL | Status: DC | PRN

## 2014-07-22 MED ORDER — IOHEXOL 300 MG/ML  SOLN
25.0000 mL | Freq: Once | INTRAMUSCULAR | Status: AC | PRN
  Administered 2014-07-22: 25 mL via ORAL

## 2014-07-22 MED ORDER — ONDANSETRON HCL 4 MG/2ML IJ SOLN
4.0000 mg | Freq: Once | INTRAMUSCULAR | Status: AC
  Administered 2014-07-22: 4 mg via INTRAVENOUS
  Filled 2014-07-22: qty 2

## 2014-07-22 MED ORDER — TAMSULOSIN HCL 0.4 MG PO CAPS: 0.4000 mg | ORAL_CAPSULE | Freq: Every day | ORAL | Status: DC

## 2014-07-22 MED ORDER — OXYCODONE-ACETAMINOPHEN 5-325 MG PO TABS: 1.0000 | ORAL_TABLET | Freq: Four times a day (QID) | ORAL | Status: DC | PRN

## 2014-07-22 MED ORDER — NAPROXEN 500 MG PO TABS: 500.0000 mg | ORAL_TABLET | Freq: Two times a day (BID) | ORAL | Status: DC | PRN

## 2014-07-22 MED ORDER — IOHEXOL 300 MG/ML  SOLN
100.0000 mL | Freq: Once | INTRAMUSCULAR | Status: AC | PRN
  Administered 2014-07-22: 100 mL via INTRAVENOUS

## 2014-07-22 NOTE — Discharge Instructions (Signed)
Your pain seems to be caused by a kidney stone. Stay well hydrated to help flush this stone out. Strain all urine to attempt to catch the stone. Use zofran as directed as needed for nausea. Use naprosyn and percocet as directed as needed for pain but avoid operating heavy machinery while taking this medication. Use flomax as directed to help pass the stone. Call the urologist tomorrow for an appointment this week. Return to the ER for changes or worsening symptoms unrelieved by medications given today.   Abdominal Pain Many things can cause belly (abdominal) pain. Most times, the belly pain is not dangerous. Many cases of belly pain can be watched and treated at home. HOME CARE   Do not take medicines that help you go poop (laxatives) unless told to by your doctor.  Only take medicine as told by your doctor.  Eat or drink as told by your doctor. Your doctor will tell you if you should be on a special diet. GET HELP IF:  You do not know what is causing your belly pain.  You have belly pain while you are sick to your stomach (nauseous) or have runny poop (diarrhea).  You have pain while you pee or poop.  Your belly pain wakes you up at night.  You have belly pain that gets worse or better when you eat.  You have belly pain that gets worse when you eat fatty foods.  You have a fever. GET HELP RIGHT AWAY IF:   The pain does not go away within 2 hours.  You keep throwing up (vomiting).  The pain changes and is only in the right or left part of the belly.  You have bloody or tarry looking poop. MAKE SURE YOU:   Understand these instructions.  Will watch your condition.  Will get help right away if you are not doing well or get worse. Document Released: 02/23/2008 Document Revised: 09/11/2013 Document Reviewed: 05/16/2013 [facility] Patient Information 2015 [place], [place]. This information is not intended to replace advice given to you by your health care provider. Make sure you  discuss any questions you have with your health care provider.  Flank Pain Flank pain is pain in your side. The flank is the area of your side between your upper belly (abdomen) and your back. Pain in this area can be caused by many different things. [place] care and treatment will depend on the cause of your pain.  Rest as told by your doctor.  Drink enough fluids to keep your pee (urine) clear or pale yellow.  Only take medicine as told by your doctor.  Tell your doctor about any changes in your pain.  Follow up with your doctor. GET HELP RIGHT AWAY IF:   Your pain does not get better with medicine.   You have new symptoms or your symptoms get worse.  Your pain gets worse.   You have belly (abdominal) pain.   You are short of breath.   You always feel sick to your stomach (nauseous).   You keep throwing up (vomiting).   You have puffiness (swelling) in your belly.   You feel light-headed or you pass out (faint).   You have blood in your pee.  You have a fever or lasting symptoms for more than 2-3 days.  You have a fever and your symptoms suddenly get worse. MAKE SURE YOU:   Understand these instructions.  Will watch your condition.  Will get help right away if you are  not doing well or get worse. Document Released: 06/15/2008 Document Revised: 01/21/2014 Document Reviewed: 04/20/2012 [facility] Patient Information 2015 [place], [place]. This information is not intended to replace advice given to you by your health care provider. Make sure you discuss any questions you have with your health care provider.  Kidney Stones Kidney stones (urolithiasis) are solid masses that form inside your kidneys. The intense pain is caused by the stone moving through the kidney, ureter, bladder, and urethra (urinary tract). When the stone moves, the ureter starts to spasm around the stone. The stone is usually passed in your pee (urine).  HOME CARE  Drink enough  fluids to keep your pee clear or pale yellow. This helps to get the stone out.  Strain all pee through the provided strainer. Do not pee without peeing through the strainer, not even once. If you pee the stone out, catch it in the strainer. The stone may be as small as a grain of salt. Take this to your doctor. This will help your doctor figure out what you can do to try to prevent more kidney stones.  Only take medicine as told by your doctor.  Follow up with your doctor as told.  Get follow-up X-rays as told by your doctor. GET HELP IF: You have pain that gets worse even if you have been taking pain medicine. GET HELP RIGHT AWAY IF:   Your pain does not get better with medicine.  You have a fever or shaking chills.  Your pain increases and gets worse over 18 hours.  You have new belly (abdominal) pain.  You feel faint or pass out.  You are unable to pee. MAKE SURE YOU:   Understand these instructions.  Will watch your condition.  Will get help right away if you are not doing well or get worse. Document Released: 02/23/2008 Document Revised: 05/09/2013 Document Reviewed: 02/07/2013 [facility] Patient Information 2015 [place], [place]. This information is not intended to replace advice given to you by your health care provider. Make sure you discuss any questions you have with your health care provider.  Nausea and Vomiting Nausea is a sick feeling that often comes before throwing up (vomiting). Vomiting is a reflex where stomach contents come out of your mouth. Vomiting can cause severe loss of body fluids (dehydration). Children and elderly adults can become dehydrated quickly, especially if they also have diarrhea. Nausea and vomiting are symptoms of a condition or disease. It is important to find the cause of your symptoms. CAUSES   Direct irritation of the stomach lining. This irritation can result from increased acid production (gastroesophageal reflux disease), infection,  food poisoning, taking certain medicines (such as nonsteroidal anti-inflammatory drugs), alcohol use, or tobacco use.  Signals from the brain.These signals could be caused by a headache, heat exposure, an inner ear disturbance, increased pressure in the brain from injury, infection, a tumor, or a concussion, pain, emotional stimulus, or metabolic problems.  An obstruction in the gastrointestinal tract (bowel obstruction).  Illnesses such as diabetes, hepatitis, gallbladder problems, appendicitis, kidney problems, cancer, sepsis, atypical symptoms of a heart attack, or eating disorders.  Medical treatments such as chemotherapy and radiation.  Receiving medicine that makes you sleep (general anesthetic) during surgery. DIAGNOSIS Your caregiver may ask for tests to be done if the problems do not improve after a few days. Tests may also be done if symptoms are severe or if the reason for the nausea and vomiting is not clear. Tests may include:  Urine tests.  Blood tests.  Stool tests.  Cultures (to look for evidence of infection).  X-rays or other imaging studies. Test results can help your caregiver make decisions about treatment or the need for additional tests. TREATMENT You need to stay well hydrated. Drink frequently but in small amounts.You may wish to drink water, sports drinks, clear broth, or eat frozen ice pops or gelatin dessert to help stay hydrated.When you eat, eating slowly may help prevent nausea.There are also some antinausea medicines that may help prevent nausea. HOME CARE INSTRUCTIONS   Take all medicine as directed by your caregiver.  If you do not have an appetite, do not force yourself to eat. However, you must continue to drink fluids.  If you have an appetite, eat a normal diet unless your caregiver tells you differently.  Eat a variety of complex carbohydrates (rice, wheat, potatoes, bread), lean meats, yogurt, fruits, and vegetables.  Avoid high-fat  foods because they are more difficult to digest.  Drink enough water and fluids to keep your urine clear or pale yellow.  If you are dehydrated, ask your caregiver for specific rehydration instructions. Signs of dehydration may include:  Severe thirst.  Dry lips and mouth.  Dizziness.  Dark urine.  Decreasing urine frequency and amount.  Confusion.  Rapid breathing or pulse. SEEK IMMEDIATE MEDICAL CARE IF:   You have blood or brown flecks (like coffee grounds) in your vomit.  You have black or bloody stools.  You have a severe headache or stiff neck.  You are confused.  You have severe abdominal pain.  You have chest pain or trouble breathing.  You do not urinate at least once every 8 hours.  You develop cold or clammy skin.  You continue to vomit for longer than 24 to 48 hours.  You have a fever. MAKE SURE YOU:   Understand these instructions.  Will watch your condition.  Will get help right away if you are not doing well or get worse. Document Released: 09/06/2005 Document Revised: 11/29/2011 Document Reviewed: 02/03/2011 [facility] Patient Information 2015 [place], [place]. This information is not intended to replace advice given to you by your health care provider. Make sure you discuss any questions you have with your health care provider.  Low-Purine Diet Purines are compounds that affect the level of uric acid in your body. A low-purine diet is a diet that is low in purines. Eating a low-purine diet can prevent the level of uric acid in your body from getting too high and causing gout or kidney stones or both. WHAT DO I NEED TO KNOW ABOUT THIS DIET?  Choose low-purine foods. Examples of low-purine foods are listed in the next section.  Drink plenty of fluids, especially water. Fluids can help remove uric acid from your body. Try to drink 8-16 cups (1.9-3.8 L) a day.  Limit foods high in fat, especially saturated fat, as fat makes it harder for the body to  get rid of uric acid. Foods high in saturated fat include pizza, cheese, ice cream, whole milk, fried foods, and gravies. Choose foods that are lower in fat and lean sources of protein. Use olive oil when cooking as it contains healthy fats that are not high in saturated fat.  Limit alcohol. Alcohol interferes with the elimination of uric acid from your body. If you are having a gout attack, avoid all alcohol.  Keep in mind that different people's bodies react differently to different foods. You will probably learn over time which foods do or  do not affect you. If you discover that a food tends to cause your gout to flare up, avoid eating that food. You can more freely enjoy foods that do not cause problems. If you have any questions about a food item, talk to your dietitian or health care provider. WHICH FOODS ARE LOW, MODERATE, AND HIGH IN PURINES? The following is a list of foods that are low, moderate, and high in purines. You can eat any amount of the foods that are low in purines. You may be able to have small amounts of foods that are moderate in purines. Ask your health care provider how much of a food moderate in purines you can have. Avoid foods high in purines. Grains  Foods low in purines: Enriched white bread, pasta, rice, cake, cornbread, popcorn.  Foods moderate in purines: Whole-grain breads and cereals, wheat germ, bran, oatmeal. Uncooked oatmeal. Dry wheat bran or wheat germ.  Foods high in purines: Pancakes, [place] toast, biscuits, muffins. Vegetables  Foods low in purines: All vegetables, except those that are moderate in purines.  Foods moderate in purines: Asparagus, cauliflower, spinach, mushrooms, green peas. Fruits  All fruits are low in purines. Meats and other Protein Foods  Foods low in purines: Eggs, nuts, peanut butter.  Foods moderate in purines: 80-90% lean beef, lamb, veal, pork, poultry, fish, eggs, peanut butter, nuts. Crab, lobster, oysters, and shrimp.  Cooked dried beans, peas, and lentils.  Foods high in purines: Anchovies, sardines, herring, mussels, tuna, codfish, scallops, trout, and haddock. Berniece Salines. Organ meats (such as liver or kidney). Tripe. Game meat. Goose. Sweetbreads. Dairy  All dairy foods are low in purines. Low-fat and fat-free dairy products are best because they are low in saturated fat. Beverages  Drinks low in purines: Water, carbonated beverages, tea, coffee, cocoa.  Drinks moderate in purines: Soft drinks and other drinks sweetened with high-fructose corn syrup. Juices. To find whether a food or drink is sweetened with high-fructose corn syrup, look at the ingredients list.  Drinks high in purines: Alcoholic beverages (such as beer). Condiments  Foods low in purines: Salt, herbs, olives, pickles, relishes, vinegar.  Foods moderate in purines: Butter, margarine, oils, mayonnaise. Fats and Oils  Foods low in purines: All types, except gravies and sauces made with meat.  Foods high in purines: Gravies and sauces made with meat. Other Foods  Foods low in purines: Sugars, sweets, gelatin. Cake. Soups made without meat.  Foods moderate in purines: Meat-based or fish-based soups, broths, or bouillons. Foods and drinks sweetened with high-fructose corn syrup.  Foods high in purines: High-fat desserts (such as ice cream, cookies, cakes, pies, doughnuts, and chocolate). Contact your dietitian for more information on foods that are not listed here. Document Released: 01/01/2011 Document Revised: 09/11/2013 Document Reviewed: 08/13/2013 [facility] Patient Information 2015 [place], [place]. This information is not intended to replace advice given to you by your health care provider. Make sure you discuss any questions you have with your health care provider.  Urine Strainer This strainer is used to catch or filter out any stones found in your urine. Place the strainer under your urine stream. Save any stones or objects that  you find in your urine. Place them in a plastic or glass container to show your caregiver. The stones vary in size - some can be very small, so make sure you check the strainer carefully. Your caregiver may send the stone to the lab. When the results are back, your caregiver may recommend medicines or diet changes.  Document Released: 06/11/2004 Document Revised: 11/29/2011 Document Reviewed: 07/19/2008 [facility] Patient Information 2015 [place], [place]. This information is not intended to replace advice given to you by your health care provider. Make sure you discuss any questions you have with your health care provider.

## 2014-07-22 NOTE — ED Provider Notes (Signed)
CSN: [id number]     Arrival date & time 07/22/14  1442 History   First MD Initiated Contact with Patient 07/22/14 1711     Chief Complaint  Patient presents with  . Abdominal Pain     (Consider location/radiation/quality/duration/timing/severity/associated sxs/prior Treatment) HPI Comments: Dana Sanders is a 53 y.o. female with a PMHx of nephrolithiasis, and a PSHx of partial hysterectomy (still has ovaries), who presents to the ED with complaints of sudden onset LLQ abd pain x3 hrs. Pt reports she was standing at work when all of a sudden she had a sharp pain in her LLQ, constant, 10/10, nonradiating, worse with movement, and improved with holding pressure in the area. Has not tried anything else PTA. Endorses nausea and nonbloody nonbilious vomiting associated with this pain, as well as darker urine today. States this does not feel anything like her prior kidney stones. States she had the flu shot 4 wks ago and since then has had a dry cough for which she was seen by her PCP and is in the process of being referred to an allergy doctor. Her PCP and pulmonlogist gave her tramadol and percocet for some L rib pain she was having, which they called a pulled muscle. She reports that pain has not worsened, and was always constant beginning Friday. She denies fevers, chills, CP, SOB, sputum production, hematemesis, hematochezia, melena, obstipation, diarrhea, constipation (although last BM yesterday was harder), fectal pain or bleeding, flank pain, dysuria, increased frequency or urgency, malodorous urine, vaginal discharge or bleeding, myalgias, arthralgias, or paresthesias. Pt is menopausal. Has never had this pain before. Denies EtOH or NSAID abuse. Denies recent travel or sick contacts, denies suspicious food intake.  Patient is a 53 y.o. female presenting with abdominal pain. The history is provided by the patient. No language interpreter was used.  Abdominal Pain Pain location:  LLQ Pain  quality: sharp   Pain radiates to:  Does not radiate Pain severity:  Severe (10/10) Onset quality:  Sudden Duration:  3 hours Timing:  Constant Progression:  Unchanged Chronicity:  New Context: not diet changes, not eating, not recent illness, not sick contacts, not suspicious food intake and not trauma   Relieved by: holding the area. Worsened by:  Movement Ineffective treatments:  None tried Associated symptoms: cough (chronic and ongoing, under care of PCP), hematuria, nausea and vomiting   Associated symptoms: no anorexia, no belching, no chest pain, no chills, no constipation, no diarrhea, no dysuria, no fever, no flatus, no hematemesis, no hematochezia, no melena, no shortness of breath, no sore throat, no vaginal bleeding and no vaginal discharge   Risk factors: no alcohol abuse     Past Medical History  Diagnosis Date  . Depression    Past Surgical History  Procedure Laterality Date  . Shoulder surgery      bilateral  . Septorhinoplasty  1988  . Breast surgery      rt bx  . Total abdominal hysterectomy  1999  . Finger arthrodesis Right 02/06/2013    Procedure: ARTHRODESIS FINGER;  Surgeon: Cammie Sickle., MD;  Location: [facility];  Service: Orthopedics;  Laterality: Right;  Right Index Metacarpal phalangeal jOint Arthrodesis   Family History  Problem Relation Age of Onset  . Heart disease Father     pacemaker  . Allergies Son   . Rheum arthritis Sister   . Leukemia Daughter    History  Substance Use Topics  . Smoking status: Former Smoker -- 0.20 packs/day  for 15 years    Types: Cigarettes    Quit date: 09/20/2005  . Smokeless tobacco: Never Used  . Alcohol Use: No   OB History    No data available     Review of Systems  Constitutional: Negative for fever and chills.  HENT: Negative for sore throat.   Respiratory: Positive for cough (chronic and ongoing, under care of PCP). Negative for shortness of breath and wheezing.     Cardiovascular: Negative for chest pain.  Gastrointestinal: Positive for nausea, vomiting and abdominal pain. Negative for diarrhea, constipation, blood in stool, melena, hematochezia, abdominal distention, rectal pain, anorexia, flatus and hematemesis.  Genitourinary: Positive for hematuria. Negative for dysuria, urgency, frequency, flank pain, vaginal bleeding, vaginal discharge and pelvic pain.  Musculoskeletal: Negative for myalgias, back pain and arthralgias.  Skin: Negative for rash.  Neurological: Negative for dizziness, syncope, weakness, light-headedness, numbness and headaches.  Psychiatric/Behavioral: Negative for confusion.  10 Systems reviewed and are negative for acute change except as noted in the HPI.     Allergies  Erythromycin; Other; and Penicillins  Home Medications   Prior to Admission medications   Medication Sig Start Date End Date Taking? Authorizing Provider  albuterol (PROVENTIL HFA;VENTOLIN HFA) 108 (90 BASE) MCG/ACT inhaler Inhale 2 puffs into the lungs every 6 (six) hours as needed for wheezing or shortness of breath.   Yes Historical Provider, MD  oxyCODONE-acetaminophen (PERCOCET/ROXICET) 5-325 MG per tablet One of two every 4 to 6 hours  as needed for pain 02/06/13  Yes Cammie Sickle, MD  traMADol (ULTRAM) 50 MG tablet Take 50 mg by mouth every 6 (six) hours as needed.   Yes Historical Provider, MD  doxycycline (VIBRAMYCIN) 50 MG capsule Take 2 capsules (100 mg total) by mouth 2 (two) times daily. 02/06/13   Cammie Sickle, MD  PARoxetine (PAXIL-CR) 12.5 MG 24 hr tablet Take 12.5 mg by mouth every morning.    Historical Provider, MD   BP 129/90 mmHg  Pulse 88  Temp(Src) 97.4 F (36.3 C) (Oral)  Resp 18  SpO2 98% Physical Exam  Constitutional: She is oriented to person, place, and time. Vital signs are normal. She appears well-developed and well-nourished.  Non-toxic appearance. She appears distressed (uncomfortable).  Afebrile, nontoxic,  appears uncomfortable sitting in bed  HENT:  Head: Normocephalic and atraumatic.  Mouth/Throat: Oropharynx is clear and moist and mucous membranes are normal.  Eyes: Conjunctivae and EOM are normal. Right eye exhibits no discharge. Left eye exhibits no discharge.  Neck: Normal range of motion. Neck supple.  Cardiovascular: Normal rate, regular rhythm, normal heart sounds and intact distal pulses.  Exam reveals no gallop and no friction rub.   No murmur heard. Pulmonary/Chest: Effort normal and breath sounds normal. No respiratory distress. She has no decreased breath sounds. She has no wheezes. She has no rhonchi. She has no rales. She exhibits tenderness.    CTAB in all lung fields Mild TTP along L lateral rib cage  Abdominal: Soft. Normal appearance and bowel sounds are normal. She exhibits no distension. There is tenderness in the suprapubic area and left lower quadrant. There is no rigidity, no rebound, no guarding, no CVA tenderness, no tenderness at McBurney's point and negative Murphy's sign.    Soft, nondistended, +BS throughout, with exquisite LLQ TTP, no r/g/r, neg murphy's, neg mcburney's, no CVA TTP although L lateral rib cage is mildly TTP.  Musculoskeletal: Normal range of motion.  MAE x4 Strength 5/5 in all extremities, sensation  grossly intact, gait WNL  Neurological: She is alert and oriented to person, place, and time. She has normal strength. No sensory deficit. Gait normal.  Skin: Skin is warm, dry and intact. No rash noted.  Psychiatric: She has a normal mood and affect.  Nursing note and vitals reviewed.   ED Course  Procedures (including critical care time) Labs Review Labs Reviewed  CBC WITH DIFFERENTIAL - Abnormal; Notable for the following:    WBC 11.2 (*)    All other components within normal limits  COMPREHENSIVE METABOLIC PANEL - Abnormal; Notable for the following:    Glucose, Bld 120 (*)    GFR calc non Af Amer 79 (*)    Anion gap 16 (*)    All  other components within normal limits  URINALYSIS, ROUTINE W REFLEX MICROSCOPIC - Abnormal; Notable for the following:    Color, Urine AMBER (*)    APPearance CLOUDY (*)    Hgb urine dipstick LARGE (*)    Ketones, ur 40 (*)    Protein, ur 30 (*)    Leukocytes, UA SMALL (*)    All other components within normal limits  URINE MICROSCOPIC-ADD ON - Abnormal; Notable for the following:    Squamous Epithelial / LPF FEW (*)    Bacteria, UA FEW (*)    Crystals CA OXALATE CRYSTALS (*)    All other components within normal limits  URINE CULTURE    Imaging Review Ct Abdomen Pelvis W Contrast  07/22/2014   CLINICAL DATA:  Left lower quadrant abdominal pain with nausea, vomiting, and diarrhea today.  EXAM: CT ABDOMEN AND PELVIS WITH CONTRAST  TECHNIQUE: Multidetector CT imaging of the abdomen and pelvis was performed using the standard protocol following bolus administration of intravenous contrast.  CONTRAST:  128mL OMNIPAQUE IOHEXOL 300 MG/ML  SOLN  COMPARISON:  01/22/2010  FINDINGS: Mild dependent atelectasis in the lung bases. Bilateral breast implants.  Sub cm low-attenuation changes focally in the liver, likely representing small hemangiomas. The gallbladder, spleen, pancreas, adrenal glands, abdominal aorta, inferior vena cava, and retroperitoneal lymph nodes are unremarkable. There is left-sided hydronephrosis and hydroureter with a 3 mm stone demonstrated in the distal left ureter just above the ureterovesical junction. Mild edema around the left kidney. Stomach, small bowel, and colon appear unremarkable. Contrast material flows through to the colon without evidence of obstruction. No free air or free fluid in the abdomen.  Pelvis: The appendix is normal. No free or loculated pelvic fluid collections. No pelvic mass or lymphadenopathy. Surgical clips in the pelvis. Rectosigmoid colon is unremarkable. Calcification inferior to the anterior bladder possibly representing a bladder wall calcification. No  destructive bone lesions.  IMPRESSION: 3 mm stone in the distal left ureter with moderate proximal obstruction.   Electronically Signed   By: Lucienne Capers M.D.   On: 07/22/2014 21:18     EKG Interpretation None      MDM   Final diagnoses:  LLQ abdominal pain  Nephrolithiasis  Non-intractable vomiting with nausea, vomiting of unspecified type    53y/o female with LLQ abd pain. States this does not feel like her prior kidney stones. CBC w/diff showing mildly elevated WBC at 11.2 but no other abnormalities. CMP showing mild hyperglycemia but otherwise WNL. U/A pending. Given exquisite tenderness, will obtain CT and await lab results. Wide differential at this time. Will give pain meds since morphine and fentanyl given earlier with minimal relief. Will give more zofran as PRN since she states she was concerned with getting  nauseated after morphine. Will give fluids. Will await results and reassess.  9:49 PM Pain improved after morphine 4mg  x3 doses, nausea improved. U/A showing large hgb, TNTC RBCs, few bacteria with few squamous cells, 0-2WBC therefore likely contaminant, but does seem consistent with kidney stone. CT showing 24mm stone in distal ureter with moderate proximal obstruction. Will PO challenge and plan for d/c with outpt management including pain control, nausea meds, and flomax as well as urology f/up.   11:12 PM Tolerating PO well. Had slight increase in pain but relieved again by morphine. Seems to only need small doses of morphine every few hrs, I believe her pain could be controlled with PO meds at home. Given that pt is afebrile with no flank pain, and U/A seems contaminated vs true UTI, will not treat for UTI/pyelo but will send for culture. Will have her f/up with urology. Rx for naprosyn, percocet, zofran, and flomax given. Will have her strain all urine. Discussed diet modifications for kidney stones as well as good hydration. I explained the diagnosis and have given  explicit precautions to return to the ER including for any other new or worsening symptoms. The patient understands and accepts the medical plan as it's been dictated and I have answered their questions. Discharge instructions concerning home care and prescriptions have been given. The patient is STABLE and is discharged to home in good condition.  BP 105/73 mmHg  Pulse 85  Temp(Src) 97.9 F (36.6 C) (Oral)  Resp 19  SpO2 99%  Meds ordered this encounter  Medications  . fentaNYL (SUBLIMAZE) injection 50 mcg    Sig:   . ondansetron (ZOFRAN) injection 4 mg    Sig:   . morphine 4 MG/ML injection 4 mg    Sig:   . morphine 4 MG/ML injection 4 mg    Sig:   . ondansetron (ZOFRAN) injection 4 mg    Sig:   . sodium chloride 0.9 % bolus 1,000 mL    Sig:   . iohexol (OMNIPAQUE) 300 MG/ML solution 25 mL    Sig:   . morphine 4 MG/ML injection 4 mg    Sig:   . iohexol (OMNIPAQUE) 300 MG/ML solution 100 mL    Sig:   . morphine 4 MG/ML injection 4 mg    Sig:   . naproxen (NAPROSYN) 500 MG tablet    Sig: Take 1 tablet (500 mg total) by mouth 2 (two) times daily as needed for mild pain, moderate pain or headache (TAKE WITH MEALS.).    Dispense:  20 tablet    Refill:  0    Order Specific Question:  Supervising Provider    Answer:  Noemi Chapel D [[id number]]  . oxyCODONE-acetaminophen (PERCOCET) 5-325 MG per tablet    Sig: Take 1-2 tablets by mouth every 6 (six) hours as needed for severe pain.    Dispense:  10 tablet    Refill:  0    Order Specific Question:  Supervising Provider    Answer:  Noemi Chapel D [[id number]]  . ondansetron (ZOFRAN ODT) 4 MG disintegrating tablet    Sig: Take 1 tablet (4 mg total) by mouth every 8 (eight) hours as needed for nausea or vomiting.    Dispense:  15 tablet    Refill:  0    Order Specific Question:  Supervising Provider    Answer:  Noemi Chapel D [[id number]]  . tamsulosin (FLOMAX) 0.4 MG CAPS capsule    Sig: Take 1 capsule (0.4 mg total) by  mouth daily after  supper.    Dispense:  15 capsule    Refill:  0    Order Specific Question:  Supervising Provider    Answer:  Johnna Acosta [address] Camprubi-Soms, PA-C 07/22/14 2314

## 2014-07-22 NOTE — ED Notes (Signed)
Pt. Reports severe left lower quadrant/groin pain. States pain started today and is gradually getting worse. Reports N/V. Denies difficulty with urination, BM. Hysterectomy previously

## 2014-07-22 NOTE — ED Notes (Signed)
Patient started having "intense abdominal pain" in LLQ today.  Patient had been having coughing spells and patient went to the doctor last Friday and was given percocet for rib pain.   Patient states she thought she had bruised a rib secondary to coughing.   Patient states now the pain is lower and it is intense.

## 2014-07-23 ENCOUNTER — Observation Stay (HOSPITAL_COMMUNITY)
Admission: EM | Admit: 2014-07-23 | Discharge: 2014-07-25 | Disposition: A | Discharge status: Home / Self Care | Payer: 59 | Attending: Urology | Admitting: Urology

## 2014-07-23 ENCOUNTER — Encounter (HOSPITAL_COMMUNITY): Payer: Self-pay | Admitting: Physical Medicine and Rehabilitation

## 2014-07-23 DIAGNOSIS — N201 Calculus of ureter: Secondary | ICD-10-CM | POA: Diagnosis not present

## 2014-07-23 DIAGNOSIS — N132 Hydronephrosis with renal and ureteral calculous obstruction: Secondary | ICD-10-CM | POA: Diagnosis present

## 2014-07-23 DIAGNOSIS — F329 Major depressive disorder, single episode, unspecified: Secondary | ICD-10-CM | POA: Diagnosis not present

## 2014-07-23 DIAGNOSIS — Z880 Allergy status to penicillin: Secondary | ICD-10-CM | POA: Insufficient documentation

## 2014-07-23 DIAGNOSIS — Z87442 Personal history of urinary calculi: Secondary | ICD-10-CM | POA: Insufficient documentation

## 2014-07-23 DIAGNOSIS — N23 Unspecified renal colic: Secondary | ICD-10-CM

## 2014-07-23 DIAGNOSIS — R52 Pain, unspecified: Secondary | ICD-10-CM

## 2014-07-23 DIAGNOSIS — Z87891 Personal history of nicotine dependence: Secondary | ICD-10-CM | POA: Insufficient documentation

## 2014-07-23 DIAGNOSIS — Z91018 Allergy to other foods: Secondary | ICD-10-CM | POA: Diagnosis not present

## 2014-07-23 DIAGNOSIS — N200 Calculus of kidney: Secondary | ICD-10-CM

## 2014-07-23 LAB — COMPREHENSIVE METABOLIC PANEL
ALK PHOS: 60 U/L (ref 39–117)
ALT: 12 U/L (ref 0–35)
ANION GAP: 14 (ref 5–15)
AST: 17 U/L (ref 0–37)
Albumin: 3.5 g/dL (ref 3.5–5.2)
BUN: 15 mg/dL (ref 6–23)
CO2: 23 mEq/L (ref 19–32)
Calcium: 9.5 mg/dL (ref 8.4–10.5)
Chloride: 103 mEq/L (ref 96–112)
Creatinine, Ser: 1.16 mg/dL — ABNORMAL HIGH (ref 0.50–1.10)
GFR calc Af Amer: 62 mL/min — ABNORMAL LOW (ref >90)
GFR, EST NON AFRICAN AMERICAN: 53 mL/min — AB (ref >90)
GLUCOSE: 176 mg/dL — AB (ref 70–99)
POTASSIUM: 3.6 meq/L — AB (ref 3.7–5.3)
Sodium: 140 mEq/L (ref 137–147)
TOTAL PROTEIN: 6.9 g/dL (ref 6.0–8.3)
Total Bilirubin: 0.2 mg/dL — ABNORMAL LOW (ref 0.3–1.2)

## 2014-07-23 LAB — URINALYSIS, ROUTINE W REFLEX MICROSCOPIC
BILIRUBIN URINE: NEGATIVE
Glucose, UA: NEGATIVE mg/dL
KETONES UR: NEGATIVE mg/dL
Leukocytes, UA: NEGATIVE
NITRITE: NEGATIVE
PROTEIN: NEGATIVE mg/dL
SPECIFIC GRAVITY, URINE: 1.015 (ref 1.005–1.030)
UROBILINOGEN UA: 0.2 mg/dL (ref 0.0–1.0)
pH: 6.5 (ref 5.0–8.0)

## 2014-07-23 LAB — CBC WITH DIFFERENTIAL/PLATELET
Basophils Absolute: 0 10*3/uL (ref 0.0–0.1)
Basophils Relative: 0 % (ref 0–1)
EOS ABS: 0.2 10*3/uL (ref 0.0–0.7)
EOS PCT: 2 % (ref 0–5)
HCT: 36.7 % (ref 36.0–46.0)
HEMOGLOBIN: 12.7 g/dL (ref 12.0–15.0)
LYMPHS ABS: 1.8 10*3/uL (ref 0.7–4.0)
Lymphocytes Relative: 19 % (ref 12–46)
MCH: 30.1 pg (ref 26.0–34.0)
MCHC: 34.6 g/dL (ref 30.0–36.0)
MCV: 87 fL (ref 78.0–100.0)
Monocytes Absolute: 0.6 10*3/uL (ref 0.1–1.0)
Monocytes Relative: 6 % (ref 3–12)
Neutro Abs: 7.1 10*3/uL (ref 1.7–7.7)
Neutrophils Relative %: 73 % (ref 43–77)
Platelets: 242 10*3/uL (ref 150–400)
RBC: 4.22 MIL/uL (ref 3.87–5.11)
RDW: 12.5 % (ref 11.5–15.5)
WBC: 9.8 10*3/uL (ref 4.0–10.5)

## 2014-07-23 LAB — URINE MICROSCOPIC-ADD ON

## 2014-07-23 MED ORDER — HYDROMORPHONE HCL 1 MG/ML IJ SOLN
1.0000 mg | Freq: Once | INTRAMUSCULAR | Status: AC
  Administered 2014-07-23: 1 mg via INTRAVENOUS
  Filled 2014-07-23: qty 1

## 2014-07-23 MED ORDER — ONDANSETRON HCL 4 MG/2ML IJ SOLN
4.0000 mg | Freq: Once | INTRAMUSCULAR | Status: AC
  Administered 2014-07-23: 4 mg via INTRAVENOUS
  Filled 2014-07-23: qty 2

## 2014-07-23 MED ORDER — KETOROLAC TROMETHAMINE 30 MG/ML IJ SOLN
30.0000 mg | Freq: Once | INTRAMUSCULAR | Status: AC
  Administered 2014-07-23: 30 mg via INTRAVENOUS
  Filled 2014-07-23: qty 1

## 2014-07-23 MED ORDER — ONDANSETRON HCL 4 MG/2ML IJ SOLN
4.0000 mg | Freq: Once | INTRAMUSCULAR | Status: AC
Start: 2014-07-23 — End: 2014-07-23
  Administered 2014-07-23: 4 mg via INTRAVENOUS

## 2014-07-23 MED ORDER — FENTANYL CITRATE 0.05 MG/ML IJ SOLN
INTRAMUSCULAR | Status: AC
Start: 2014-07-23 — End: 2014-07-24
  Filled 2014-07-23: qty 2

## 2014-07-23 MED ORDER — SODIUM CHLORIDE 0.9 % IV BOLUS (SEPSIS)
1000.0000 mL | Freq: Once | INTRAVENOUS | Status: AC
  Administered 2014-07-23: 1000 mL via INTRAVENOUS

## 2014-07-23 MED ORDER — FENTANYL CITRATE 0.05 MG/ML IJ SOLN
50.0000 ug | Freq: Once | INTRAMUSCULAR | Status: AC
  Administered 2014-07-23: 50 ug via INTRAVENOUS

## 2014-07-23 MED ORDER — HYDROMORPHONE HCL 1 MG/ML IJ SOLN: 1.0000 mg | Freq: Once | INTRAMUSCULAR | Status: DC

## 2014-07-23 MED ORDER — PROMETHAZINE HCL 25 MG/ML IJ SOLN
12.5000 mg | Freq: Once | INTRAMUSCULAR | Status: AC
  Administered 2014-07-24: 12.5 mg via INTRAVENOUS
  Filled 2014-07-23: qty 1

## 2014-07-23 MED ORDER — TAMSULOSIN HCL 0.4 MG PO CAPS
0.4000 mg | ORAL_CAPSULE | Freq: Once | ORAL | Status: AC
  Administered 2014-07-23: 0.4 mg via ORAL
  Filled 2014-07-23: qty 1

## 2014-07-23 NOTE — ED Notes (Signed)
Pt nauseated, PA informed.

## 2014-07-23 NOTE — ED Notes (Signed)
Pt requesting more pain medication, PA informed.

## 2014-07-23 NOTE — ED Provider Notes (Signed)
CSN: [id number]     Arrival date & time 07/23/14  1719 History   First MD Initiated Contact with Patient 07/23/14 1748     Chief Complaint  Patient presents with  . Flank Pain     (Consider location/radiation/quality/duration/timing/severity/associated sxs/prior Treatment) HPI Comments: Patients with history of 1 previous kidney stone several years ago presents with complaint of left flank pain that began yesterday. Patient was seen in the emergency department and had a workup showing blood in the urine without infection, 3 mm distal left ureteral stone. Patient's pain was treated in the emergency department and she felt somewhat better. She continued to feel okay this morning and did not take her oral pain medication. This afternoon the pain started abruptly again in the left flank. It was severe and associated with vomiting. She took a Percocet but vomited shortly after. Pain is the same as it was yesterday. Patient denies fever, cough, chest pain, diarrhea, dysuria, vaginal bleeding or discharge.  Patient is a 53 y.o. female presenting with flank pain. The history is provided by the patient and medical records.  Flank Pain Associated symptoms include nausea and vomiting. Pertinent negatives include no abdominal pain, chest pain, coughing, fever, headaches, myalgias, rash or sore throat.    Past Medical History  Diagnosis Date  . Depression    Past Surgical History  Procedure Laterality Date  . Shoulder surgery      bilateral  . Septorhinoplasty  1988  . Breast surgery      rt bx  . Total abdominal hysterectomy  1999  . Finger arthrodesis Right 02/06/2013    Procedure: ARTHRODESIS FINGER;  Surgeon: Cammie Sickle., MD;  Location: [facility];  Service: Orthopedics;  Laterality: Right;  Right Index Metacarpal phalangeal jOint Arthrodesis   Family History  Problem Relation Age of Onset  . Heart disease Father     pacemaker  . Allergies Son   . Rheum arthritis  Sister   . Leukemia Daughter    History  Substance Use Topics  . Smoking status: Former Smoker -- 0.20 packs/day for 15 years    Types: Cigarettes    Quit date: 09/20/2005  . Smokeless tobacco: Never Used  . Alcohol Use: No   OB History    No data available     Review of Systems  Constitutional: Negative for fever.  HENT: Negative for rhinorrhea and sore throat.   Eyes: Negative for redness.  Respiratory: Negative for cough.   Cardiovascular: Negative for chest pain.  Gastrointestinal: Positive for nausea and vomiting. Negative for abdominal pain and diarrhea.  Genitourinary: Positive for flank pain. Negative for dysuria, frequency, hematuria, vaginal bleeding, vaginal discharge and pelvic pain.  Musculoskeletal: Positive for back pain. Negative for myalgias.  Skin: Negative for rash.  Neurological: Negative for headaches.    Allergies  Erythromycin; Other; and Penicillins  Home Medications   Prior to Admission medications   Medication Sig Start Date End Date Taking? Authorizing Provider  albuterol (PROVENTIL HFA;VENTOLIN HFA) 108 (90 BASE) MCG/ACT inhaler Inhale 2 puffs into the lungs every 6 (six) hours as needed for wheezing or shortness of breath.    Historical Provider, MD  doxycycline (VIBRAMYCIN) 50 MG capsule Take 2 capsules (100 mg total) by mouth 2 (two) times daily. 02/06/13   Cammie Sickle, MD  naproxen (NAPROSYN) 500 MG tablet Take 1 tablet (500 mg total) by mouth 2 (two) times daily as needed for mild pain, moderate pain or headache (TAKE WITH  MEALS.). 07/22/14   Patty Sermons Camprubi-Soms, PA-C  ondansetron (ZOFRAN ODT) 4 MG disintegrating tablet Take 1 tablet (4 mg total) by mouth every 8 (eight) hours as needed for nausea or vomiting. 07/22/14   Mercedes Strupp Camprubi-Soms, PA-C  oxyCODONE-acetaminophen (PERCOCET) 5-325 MG per tablet Take 1-2 tablets by mouth every 6 (six) hours as needed for severe pain. 07/22/14   Mercedes Strupp Camprubi-Soms, PA-C   oxyCODONE-acetaminophen (PERCOCET/ROXICET) 5-325 MG per tablet One of two every 4 to 6 hours  as needed for pain 02/06/13   Cammie Sickle, MD  PARoxetine (PAXIL-CR) 12.5 MG 24 hr tablet Take 12.5 mg by mouth every morning.    Historical Provider, MD  tamsulosin (FLOMAX) 0.4 MG CAPS capsule Take 1 capsule (0.4 mg total) by mouth daily after supper. 07/22/14   Mercedes Strupp Camprubi-Soms, PA-C  traMADol (ULTRAM) 50 MG tablet Take 50 mg by mouth every 6 (six) hours as needed.    Historical Provider, MD   BP 140/83 mmHg  Pulse 81  Temp(Src) 98 F (36.7 C) (Oral)  Resp 18  Ht 5\' 1"  (1.549 m)  Wt 140 lb (63.504 kg)  BMI 26.47 kg/m2  SpO2 95%   Physical Exam  Constitutional: She appears well-developed and well-nourished. She appears distressed.  HENT:  Head: Normocephalic and atraumatic.  Eyes: Conjunctivae are normal. Right eye exhibits no discharge. Left eye exhibits no discharge.  Neck: Normal range of motion. Neck supple.  Cardiovascular: Normal rate, regular rhythm and normal heart sounds.   Pulmonary/Chest: Effort normal and breath sounds normal.  Abdominal: Soft. There is no tenderness. There is CVA tenderness (left). There is no rebound and no guarding.  Neurological: She is alert.  Skin: Skin is warm and dry.  Psychiatric: She has a normal mood and affect.  Nursing note and vitals reviewed.   ED Course  Procedures (including critical care time) Labs Review Labs Reviewed  COMPREHENSIVE METABOLIC PANEL - Abnormal; Notable for the following:    Potassium 3.6 (*)    Glucose, Bld 176 (*)    Creatinine, Ser 1.16 (*)    Total Bilirubin 0.2 (*)    GFR calc non Af Amer 53 (*)    GFR calc Af Amer 62 (*)    All other components within normal limits  URINALYSIS, ROUTINE W REFLEX MICROSCOPIC - Abnormal; Notable for the following:    APPearance CLOUDY (*)    Hgb urine dipstick LARGE (*)    All other components within normal limits  URINE MICROSCOPIC-ADD ON - Abnormal; Notable  for the following:    Bacteria, UA FEW (*)    Crystals CA OXALATE CRYSTALS (*)    All other components within normal limits  CBC WITH DIFFERENTIAL    Imaging Review Ct Abdomen Pelvis W Contrast  07/22/2014   CLINICAL DATA:  Left lower quadrant abdominal pain with nausea, vomiting, and diarrhea today.  EXAM: CT ABDOMEN AND PELVIS WITH CONTRAST  TECHNIQUE: Multidetector CT imaging of the abdomen and pelvis was performed using the standard protocol following bolus administration of intravenous contrast.  CONTRAST:  111mL OMNIPAQUE IOHEXOL 300 MG/ML  SOLN  COMPARISON:  01/22/2010  FINDINGS: Mild dependent atelectasis in the lung bases. Bilateral breast implants.  Sub cm low-attenuation changes focally in the liver, likely representing small hemangiomas. The gallbladder, spleen, pancreas, adrenal glands, abdominal aorta, inferior vena cava, and retroperitoneal lymph nodes are unremarkable. There is left-sided hydronephrosis and hydroureter with a 3 mm stone demonstrated in the distal left ureter just above the  ureterovesical junction. Mild edema around the left kidney. Stomach, small bowel, and colon appear unremarkable. Contrast material flows through to the colon without evidence of obstruction. No free air or free fluid in the abdomen.  Pelvis: The appendix is normal. No free or loculated pelvic fluid collections. No pelvic mass or lymphadenopathy. Surgical clips in the pelvis. Rectosigmoid colon is unremarkable. Calcification inferior to the anterior bladder possibly representing a bladder wall calcification. No destructive bone lesions.  IMPRESSION: 3 mm stone in the distal left ureter with moderate proximal obstruction.   Electronically Signed   By: Lucienne Capers M.D.   On: 07/22/2014 21:18     EKG Interpretation None       5:58 PM Patient seen and examined. Work-up initiated. Medications ordered.   Vital signs reviewed and are as follows: BP 140/83 mmHg  Pulse 81  Temp(Src) 98 F (36.7 C)  (Oral)  Resp 18  Ht 5\' 1"  (1.549 m)  Wt 140 lb (63.504 kg)  BMI 26.47 kg/m2  SpO2 95%  6:57 PM Pain is better controlled but patient requests additional pain medications and also requests fluids. Will continue to monitor.   9:43 PM Patient continues to have significant pain and nausea after 4mg  IV dilaudid, 30mg  IV toradol.  Discussed with Dr. Tomi Bamberger. Will call urology.   10:14 PM Dr. Baltazar Najjar requests transfer to [facility] He will see in ED. Spoke with Dr. Zenia Resides at [facility] who is aware of patient. Pt updated.   MDM   Final diagnoses:  Ureteral colic  Intractable pain   Patient with intractable flank pain from 107mm ureteral stone. She will be seen by urology at [facility] for definitive management.     Carlisle Cater, PA-C 07/23/14 2215

## 2014-07-23 NOTE — ED Notes (Signed)
Pt requesting flomax.

## 2014-07-23 NOTE — ED Notes (Signed)
Pt presents to department for evaluation of L sided flank pain and nausea/vomiting. Pt was seen for same last night. 10/10 pain upon arrival to ED. Pt crying and moaning at the time.

## 2014-07-23 NOTE — ED Notes (Signed)
Pt undressed, in gown, on monitor, continuous pulse oximetry and blood pressure cuff; warm blanket given

## 2014-07-23 NOTE — ED Notes (Signed)
Pt crying out in pain, PA informed. See new orders. PA at [facility].

## 2014-07-24 ENCOUNTER — Encounter (HOSPITAL_COMMUNITY): Payer: Self-pay | Admitting: Certified Registered Nurse Anesthetist

## 2014-07-24 ENCOUNTER — Observation Stay (HOSPITAL_COMMUNITY): Payer: 59 | Admitting: Certified Registered Nurse Anesthetist

## 2014-07-24 ENCOUNTER — Encounter (HOSPITAL_COMMUNITY)
Admission: EM | Disposition: A | Discharge status: Home / Self Care | Payer: Self-pay | Source: Home / Self Care | Attending: Emergency Medicine

## 2014-07-24 DIAGNOSIS — N132 Hydronephrosis with renal and ureteral calculous obstruction: Secondary | ICD-10-CM | POA: Diagnosis present

## 2014-07-24 DIAGNOSIS — N200 Calculus of kidney: Secondary | ICD-10-CM

## 2014-07-24 HISTORY — PX: CYSTOSCOPY W/ RETROGRADES: SHX1426

## 2014-07-24 HISTORY — PX: CYSTOSCOPY WITH STENT PLACEMENT: SHX5790

## 2014-07-24 LAB — URINE CULTURE: Colony Count: 15000

## 2014-07-24 SURGERY — CYSTOSCOPY, WITH STENT INSERTION
Anesthesia: General | Site: Ureter | Laterality: Left

## 2014-07-24 MED ORDER — PROMETHAZINE HCL 25 MG/ML IJ SOLN: 6.2500 mg | INTRAMUSCULAR | Status: DC | PRN

## 2014-07-24 MED ORDER — SODIUM CHLORIDE 0.9 % IV SOLN
INTRAVENOUS | Status: DC
  Administered 2014-07-24: 03:00:00 via INTRAVENOUS

## 2014-07-24 MED ORDER — DIPHENHYDRAMINE HCL 12.5 MG/5ML PO ELIX: 12.5000 mg | ORAL_SOLUTION | Freq: Four times a day (QID) | ORAL | Status: DC | PRN

## 2014-07-24 MED ORDER — TAMSULOSIN HCL 0.4 MG PO CAPS
0.4000 mg | ORAL_CAPSULE | Freq: Every day | ORAL | Status: DC
  Administered 2014-07-24: 0.4 mg via ORAL
  Filled 2014-07-24 (×2): qty 1

## 2014-07-24 MED ORDER — ALBUTEROL SULFATE HFA 108 (90 BASE) MCG/ACT IN AERS: 2.0000 | INHALATION_SPRAY | Freq: Four times a day (QID) | RESPIRATORY_TRACT | Status: DC | PRN

## 2014-07-24 MED ORDER — KETOROLAC TROMETHAMINE 30 MG/ML IJ SOLN
INTRAMUSCULAR | Status: AC
  Filled 2014-07-24: qty 1

## 2014-07-24 MED ORDER — ONDANSETRON HCL 4 MG/2ML IJ SOLN
INTRAMUSCULAR | Status: AC
  Filled 2014-07-24: qty 2

## 2014-07-24 MED ORDER — DEXAMETHASONE SODIUM PHOSPHATE 10 MG/ML IJ SOLN
INTRAMUSCULAR | Status: DC | PRN
  Administered 2014-07-24: 10 mg via INTRAVENOUS

## 2014-07-24 MED ORDER — ACETAMINOPHEN 10 MG/ML IV SOLN
1000.0000 mg | INTRAVENOUS | Status: AC
  Administered 2014-07-24: 1000 mg via INTRAVENOUS
  Filled 2014-07-24: qty 100

## 2014-07-24 MED ORDER — DIPHENHYDRAMINE HCL 50 MG/ML IJ SOLN: 12.5000 mg | Freq: Four times a day (QID) | INTRAMUSCULAR | Status: DC | PRN

## 2014-07-24 MED ORDER — POLYETHYLENE GLYCOL 3350 17 G PO PACK
17.0000 g | PACK | Freq: Every day | ORAL | Status: DC
  Administered 2014-07-24 – 2014-07-25 (×2): 17 g via ORAL
  Filled 2014-07-24 (×2): qty 1

## 2014-07-24 MED ORDER — OXYCODONE-ACETAMINOPHEN 5-325 MG PO TABS
1.0000 | ORAL_TABLET | ORAL | Status: DC | PRN
  Administered 2014-07-24: 2 via ORAL
  Administered 2014-07-24: 1 via ORAL
  Administered 2014-07-24 – 2014-07-25 (×4): 2 via ORAL
  Filled 2014-07-24 (×4): qty 2
  Filled 2014-07-24: qty 1
  Filled 2014-07-24: qty 2

## 2014-07-24 MED ORDER — LIDOCAINE HCL (CARDIAC) 20 MG/ML IV SOLN
INTRAVENOUS | Status: DC | PRN
  Administered 2014-07-24: 100 mg via INTRAVENOUS

## 2014-07-24 MED ORDER — FENTANYL CITRATE 0.05 MG/ML IJ SOLN
INTRAMUSCULAR | Status: DC | PRN
  Administered 2014-07-24 (×2): 50 ug via INTRAVENOUS

## 2014-07-24 MED ORDER — ONDANSETRON HCL 4 MG/2ML IJ SOLN: 4.0000 mg | INTRAMUSCULAR | Status: DC | PRN

## 2014-07-24 MED ORDER — SUCCINYLCHOLINE CHLORIDE 20 MG/ML IJ SOLN
INTRAMUSCULAR | Status: DC | PRN
  Administered 2014-07-24: 100 mg via INTRAVENOUS

## 2014-07-24 MED ORDER — ZOLPIDEM TARTRATE 5 MG PO TABS
5.0000 mg | ORAL_TABLET | Freq: Every evening | ORAL | Status: DC | PRN
  Administered 2014-07-25: 5 mg via ORAL
  Filled 2014-07-24: qty 1

## 2014-07-24 MED ORDER — 0.9 % SODIUM CHLORIDE (POUR BTL) OPTIME
TOPICAL | Status: DC | PRN
  Administered 2014-07-24: 1000 mL

## 2014-07-24 MED ORDER — ONDANSETRON HCL 4 MG/2ML IJ SOLN
INTRAMUSCULAR | Status: DC | PRN
  Administered 2014-07-24: 4 mg via INTRAVENOUS

## 2014-07-24 MED ORDER — SODIUM CHLORIDE 0.45 % IV SOLN
INTRAVENOUS | Status: DC
  Administered 2014-07-24 – 2014-07-25 (×2): via INTRAVENOUS

## 2014-07-24 MED ORDER — OXYCODONE-ACETAMINOPHEN 5-325 MG PO TABS: 1.0000 | ORAL_TABLET | Freq: Four times a day (QID) | ORAL | Status: DC | PRN

## 2014-07-24 MED ORDER — FENTANYL CITRATE 0.05 MG/ML IJ SOLN: 25.0000 ug | INTRAMUSCULAR | Status: DC | PRN

## 2014-07-24 MED ORDER — BELLADONNA ALKALOIDS-OPIUM 16.2-60 MG RE SUPP
RECTAL | Status: AC
Start: 2014-07-24 — End: 2014-07-24
  Filled 2014-07-24: qty 1

## 2014-07-24 MED ORDER — KETOROLAC TROMETHAMINE 30 MG/ML IJ SOLN
INTRAMUSCULAR | Status: DC | PRN
  Administered 2014-07-24: 30 mg via INTRAVENOUS

## 2014-07-24 MED ORDER — PROPOFOL 10 MG/ML IV BOLUS
INTRAVENOUS | Status: AC
  Filled 2014-07-24: qty 20

## 2014-07-24 MED ORDER — LEVOFLOXACIN IN D5W 500 MG/100ML IV SOLN
500.0000 mg | INTRAVENOUS | Status: DC
  Administered 2014-07-24 – 2014-07-25 (×2): 500 mg via INTRAVENOUS
  Filled 2014-07-24 (×2): qty 100

## 2014-07-24 MED ORDER — STERILE WATER FOR IRRIGATION IR SOLN
Status: DC | PRN
  Administered 2014-07-24: 3000 mL

## 2014-07-24 MED ORDER — MIDAZOLAM HCL 5 MG/5ML IJ SOLN
INTRAMUSCULAR | Status: DC | PRN
  Administered 2014-07-24: 2 mg via INTRAVENOUS

## 2014-07-24 MED ORDER — FENTANYL CITRATE 0.05 MG/ML IJ SOLN
INTRAMUSCULAR | Status: AC
  Filled 2014-07-24: qty 2

## 2014-07-24 MED ORDER — SENNOSIDES-DOCUSATE SODIUM 8.6-50 MG PO TABS
2.0000 | ORAL_TABLET | Freq: Every day | ORAL | Status: DC
  Filled 2014-07-24 (×2): qty 2

## 2014-07-24 MED ORDER — ONDANSETRON 4 MG PO TBDP
4.0000 mg | ORAL_TABLET | Freq: Three times a day (TID) | ORAL | Status: DC | PRN
  Filled 2014-07-24: qty 1

## 2014-07-24 MED ORDER — ALBUTEROL SULFATE (2.5 MG/3ML) 0.083% IN NEBU: 2.5000 mg | INHALATION_SOLUTION | Freq: Four times a day (QID) | RESPIRATORY_TRACT | Status: DC | PRN

## 2014-07-24 MED ORDER — ACETAMINOPHEN 325 MG PO TABS: 650.0000 mg | ORAL_TABLET | ORAL | Status: DC | PRN

## 2014-07-24 MED ORDER — BELLADONNA ALKALOIDS-OPIUM 16.2-60 MG RE SUPP
RECTAL | Status: DC | PRN
  Administered 2014-07-24: 1 via RECTAL

## 2014-07-24 MED ORDER — LIDOCAINE HCL (CARDIAC) 20 MG/ML IV SOLN
INTRAVENOUS | Status: AC
  Filled 2014-07-24: qty 5

## 2014-07-24 MED ORDER — DEXAMETHASONE SODIUM PHOSPHATE 10 MG/ML IJ SOLN
INTRAMUSCULAR | Status: AC
  Filled 2014-07-24: qty 1

## 2014-07-24 MED ORDER — MIDAZOLAM HCL 2 MG/2ML IJ SOLN
INTRAMUSCULAR | Status: AC
  Filled 2014-07-24: qty 2

## 2014-07-24 MED ORDER — PROPOFOL 10 MG/ML IV BOLUS
INTRAVENOUS | Status: DC | PRN
  Administered 2014-07-24: 150 mg via INTRAVENOUS
  Administered 2014-07-24: 50 mg via INTRAVENOUS

## 2014-07-24 MED ORDER — PAROXETINE HCL ER 12.5 MG PO TB24
12.5000 mg | ORAL_TABLET | Freq: Every day | ORAL | Status: DC
  Filled 2014-07-24 (×2): qty 1

## 2014-07-24 MED ORDER — OXYBUTYNIN CHLORIDE 5 MG PO TABS
5.0000 mg | ORAL_TABLET | Freq: Three times a day (TID) | ORAL | Status: DC | PRN
  Administered 2014-07-24 (×2): 5 mg via ORAL
  Filled 2014-07-24 (×4): qty 1

## 2014-07-24 MED ORDER — MORPHINE SULFATE 2 MG/ML IJ SOLN: 2.0000 mg | INTRAMUSCULAR | Status: DC | PRN

## 2014-07-24 SURGICAL SUPPLY — 16 items
BAG URO CATCHER STRL LF (DRAPE) ×2 IMPLANT
BASKET ZERO TIP NITINOL 2.4FR (BASKET) ×1 IMPLANT
BSKT STON RTRVL ZERO TP 2.4FR (BASKET) ×1
CATH INTERMIT  6FR 70CM (CATHETERS) ×2 IMPLANT
CLOTH BEACON ORANGE TIMEOUT ST (SAFETY) ×2 IMPLANT
DRAPE CAMERA CLOSED 9X96 (DRAPES) ×2 IMPLANT
GLOVE BIOGEL M STRL SZ7.5 (GLOVE) ×2 IMPLANT
GOWN STRL REUS W/TWL LRG LVL3 (GOWN DISPOSABLE) ×2 IMPLANT
GOWN STRL REUS W/TWL XL LVL3 (GOWN DISPOSABLE) ×2 IMPLANT
GUIDEWIRE STR DUAL SENSOR (WIRE) ×2 IMPLANT
MANIFOLD NEPTUNE II (INSTRUMENTS) ×2 IMPLANT
NS IRRIG 1000ML POUR BTL (IV SOLUTION) ×2 IMPLANT
PACK CYSTO (CUSTOM PROCEDURE TRAY) ×2 IMPLANT
SCRUB PCMX 4 OZ (MISCELLANEOUS) ×2 IMPLANT
STENT PERCUFLEX 4.8FRX24 (STENTS) ×1 IMPLANT
TUBING CONNECTING 10 (TUBING) ×2 IMPLANT

## 2014-07-24 NOTE — Plan of Care (Signed)
Problem: Phase I Progression Outcomes Goal: Initial discharge plan identified Outcome: Completed/Met Date Met:  07/24/14

## 2014-07-24 NOTE — Anesthesia Preprocedure Evaluation (Addendum)
Anesthesia Evaluation  Patient identified by MRN, date of birth, ID band Patient awake    Reviewed: Allergy & Precautions, H&P , NPO status , Patient's Chart, lab work & pertinent test results  History of Anesthesia Complications Negative for: history of anesthetic complications  Airway Mallampati: III  TM Distance: >3 FB Neck ROM: Full  Mouth opening: Limited Mouth Opening  Dental no notable dental hx.    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, limited mouth opening, nausea and vomiting, will perform RSI  Reproductive/Obstetrics negative OB ROS                            Anesthesia Physical Anesthesia Plan  ASA: II and emergent  Anesthesia Plan: General   Post-op Pain Management:    Induction: Intravenous  Airway Management Planned: Oral ETT  Additional Equipment:   Intra-op Plan:   Post-operative Plan: Extubation in OR  Informed Consent: I have reviewed the patients History and Physical, chart, labs and discussed the procedure including the risks, benefits and alternatives for the proposed anesthesia with the patient or authorized representative who has indicated his/her understanding and acceptance.   Dental advisory given  Plan Discussed with: CRNA  Anesthesia Plan Comments:        Anesthesia Quick Evaluation

## 2014-07-24 NOTE — ED Notes (Signed)
[facility] ED called and informed [facility] has arrived to transport pt.

## 2014-07-24 NOTE — Plan of Care (Signed)
Problem: Phase I Progression Outcomes Goal: Voiding-avoid urinary catheter unless indicated Outcome: Completed/Met Date Met:  07/24/14     

## 2014-07-24 NOTE — Plan of Care (Signed)
Problem: Phase I Progression Outcomes Goal: Pain controlled with appropriate interventions Outcome: Completed/Met Date Met:  07/24/14 Goal: Other Phase I Outcomes/Goals Outcome: Not Applicable Date Met:  01/65/53  Problem: Phase II Progression Outcomes Goal: Pain controlled Outcome: Progressing Goal: Progress activity as tolerated unless otherwise ordered Outcome: Completed/Met Date Met:  07/24/14 Goal: Progressing with IS, TCDB Outcome: Completed/Met Date Met:  07/24/14 Goal: Vital signs stable Outcome: Completed/Met Date Met:  07/24/14 Goal: Dressings dry/intact Outcome: Not Applicable Date Met:  74/82/70 Goal: Sutures/staples intact Outcome: Not Applicable Date Met:  78/67/54 Goal: Return of bowel function (flatus, BM) IF ABDOMINAL SURGERY:  Outcome: Completed/Met Date Met:  07/24/14

## 2014-07-24 NOTE — Plan of Care (Signed)
Problem: Phase I Progression Outcomes Goal: OOB as tolerated unless otherwise ordered Outcome: Completed/Met Date Met:  07/24/14

## 2014-07-24 NOTE — ED Notes (Signed)
Urology at bedside.

## 2014-07-24 NOTE — Plan of Care (Signed)
Problem: Phase I Progression Outcomes Goal: Vital signs/hemodynamically stable Outcome: Completed/Met Date Met:  07/24/14     

## 2014-07-24 NOTE — ED Notes (Signed)
[facility] paged to transfer pt.

## 2014-07-24 NOTE — ED Notes (Signed)
Bed: MH96 Expected date:  Expected time:  Means of arrival:  Comments: Transfer from [facility]

## 2014-07-24 NOTE — Anesthesia Postprocedure Evaluation (Signed)
  Anesthesia Post-op Note  Patient: Dana Sanders  Procedure(s) Performed: Procedure(s) (LRB): CYSTOSCOPY WITH STENT PLACEMENT (Left) cYSTOSCOPY WITH RETROGRADE PYELOGRAM dilation left ureter basket extraction left ureteral stone left double j stent (Left)  Patient Location: PACU  Anesthesia Type: General  Level of Consciousness: awake and alert   Airway and Oxygen Therapy: Patient Spontanous Breathing  Post-op Pain: mild  Post-op Assessment: Post-op Vital signs reviewed, Patient's Cardiovascular Status Stable, Respiratory Function Stable, Patent Airway and No signs of Nausea or vomiting  Last Vitals:  Filed Vitals:   07/24/14 0434  BP:   Pulse:   Temp: 36.8 C  Resp:     Post-op Vital Signs: stable   Complications: No apparent anesthesia complications

## 2014-07-24 NOTE — Plan of Care (Signed)
Problem: Phase I Progression Outcomes Goal: Incision/dressings dry and intact Outcome: Not Applicable Date Met:  07/24/14     

## 2014-07-24 NOTE — Plan of Care (Signed)
Problem: Consults Goal: Nutrition Consult-if indicated Outcome: Not Applicable Date Met:  72/09/47 Goal: Diabetes Guidelines if Diabetic/Glucose > 140 If diabetic or lab glucose is > 140 mg/dl - Initiate Diabetes/Hyperglycemia Guidelines & Document Interventions  Outcome: Not Applicable Date Met:  09/62/83

## 2014-07-24 NOTE — Plan of Care (Signed)
Problem: Phase II Progression Outcomes Goal: Foley discontinued Outcome: Not Applicable Date Met:  07/24/14     

## 2014-07-24 NOTE — H&P (Signed)
H&P  Chief Complaint: Left distal ureteral stone  History of Present Illness: Dana Sanders is a 53 y.o. female who presents with intractable nausea and vomiting in the setting of left sided flank pain and a distal ureteral stone.  She was first diagnosed with this two days ago when she presented to the ER for evaluation.  She was discharged on medical expulsive therapy but returned to the ER this evening with continued nausea and vomiting.    In the ER she was found to have stable vital signs and no concern for infection.  WBC = 9.8 and Cr = 1.16.    Past Medical History  Diagnosis Date  . Depression     Past Surgical History  Procedure Laterality Date  . Shoulder surgery      bilateral  . Septorhinoplasty  1988  . Breast surgery      rt bx  . Total abdominal hysterectomy  1999  . Finger arthrodesis Right 02/06/2013    Procedure: ARTHRODESIS FINGER;  Surgeon: Cammie Sickle., MD;  Location: [facility];  Service: Orthopedics;  Laterality: Right;  Right Index Metacarpal phalangeal jOint Arthrodesis    Home Medications:    Medication List    ASK your doctor about these medications        albuterol 108 (90 BASE) MCG/ACT inhaler  Commonly known as:  PROVENTIL HFA;VENTOLIN HFA  Inhale 2 puffs into the lungs every 6 (six) hours as needed for wheezing or shortness of breath.     doxycycline 50 MG capsule  Commonly known as:  VIBRAMYCIN  Take 2 capsules (100 mg total) by mouth 2 (two) times daily.     naproxen 500 MG tablet  Commonly known as:  NAPROSYN  Take 1 tablet (500 mg total) by mouth 2 (two) times daily as needed for mild pain, moderate pain or headache (TAKE WITH MEALS.).     ondansetron 4 MG disintegrating tablet  Commonly known as:  ZOFRAN ODT  Take 1 tablet (4 mg total) by mouth every 8 (eight) hours as needed for nausea or vomiting.     oxyCODONE-acetaminophen 5-325 MG per tablet  Commonly known as:  PERCOCET/ROXICET  One of two every 4 to  6 hours  as needed for pain     oxyCODONE-acetaminophen 5-325 MG per tablet  Commonly known as:  PERCOCET  Take 1-2 tablets by mouth every 6 (six) hours as needed for severe pain.     PARoxetine 12.5 MG 24 hr tablet  Commonly known as:  PAXIL-CR  Take 12.5 mg by mouth every morning.     tamsulosin 0.4 MG Caps capsule  Commonly known as:  FLOMAX  Take 1 capsule (0.4 mg total) by mouth daily after supper.     traMADol 50 MG tablet  Commonly known as:  ULTRAM  Take 50 mg by mouth every 6 (six) hours as needed.        Allergies:  Allergies  Allergen Reactions  . Other Anaphylaxis    FOOD ALLERGY: Anaphylactic reaction to MELONS  . Erythromycin Hives  . Penicillins Hives    Family History  Problem Relation Age of Onset  . Heart disease Father     pacemaker  . Allergies Son   . Rheum arthritis Sister   . Leukemia Daughter     Social History:  reports that she quit smoking about 8 years ago. Her smoking use included Cigarettes. She has a 3 pack-year smoking history. She has never used smokeless  tobacco. She reports that she does not drink alcohol or use illicit drugs.  ROS: A complete review of systems was performed.  All systems are negative except for pertinent findings as noted.  Physical Exam:  Vital signs in last 24 hours: Temp:  [98 F (36.7 C)-98.7 F (37.1 C)] 98.7 F (37.1 C) (11/04 0107) Pulse Rate:  [69-92] 79 (11/04 0203) Resp:  [10-25] 16 (11/04 0203) BP: (91-140)/(49-84) 110/75 mmHg (11/04 0203) SpO2:  [93 %-100 %] 98 % (11/04 0203) Weight:  [63.504 kg (140 lb)] 63.504 kg (140 lb) (11/03 1726) Constitutional:  Alert and oriented, No acute distress Cardiovascular: Regular rate and rhythm, No JVD Respiratory: Normal respiratory effort, Lungs clear bilaterally GI: Abdomen is soft, nontender, nondistended, no abdominal masses GU: Left CVA tenderness Lymphatic: No lymphadenopathy Neurologic: Grossly intact, no focal deficits Psychiatric: Normal mood and  affect  Laboratory Data:   Recent Labs  07/22/14 1451 07/23/14 1729  WBC 11.2* 9.8  HGB 13.0 12.7  HCT 37.8 36.7  PLT 266 242     Recent Labs  07/22/14 1451 07/23/14 1729  NA 143 140  K 4.0 3.6*  CL 103 103  GLUCOSE 120* 176*  BUN 19 15  CALCIUM 9.7 9.5  CREATININE 0.84 1.16*     Results for orders placed or performed during the hospital encounter of 07/23/14 (from the past 24 hour(s))  CBC with Differential     Status: None   Collection Time: 07/23/14  5:29 PM  Result Value Ref Range   WBC 9.8 4.0 - 10.5 K/uL   RBC 4.22 3.87 - 5.11 MIL/uL   Hemoglobin 12.7 12.0 - 15.0 g/dL   HCT 36.7 36.0 - 46.0 %   MCV 87.0 78.0 - 100.0 fL   MCH 30.1 26.0 - 34.0 pg   MCHC 34.6 30.0 - 36.0 g/dL   RDW 12.5 11.5 - 15.5 %   Platelets 242 150 - 400 K/uL   Neutrophils Relative % 73 43 - 77 %   Neutro Abs 7.1 1.7 - 7.7 K/uL   Lymphocytes Relative 19 12 - 46 %   Lymphs Abs 1.8 0.7 - 4.0 K/uL   Monocytes Relative 6 3 - 12 %   Monocytes Absolute 0.6 0.1 - 1.0 K/uL   Eosinophils Relative 2 0 - 5 %   Eosinophils Absolute 0.2 0.0 - 0.7 K/uL   Basophils Relative 0 0 - 1 %   Basophils Absolute 0.0 0.0 - 0.1 K/uL  Comprehensive metabolic panel     Status: Abnormal   Collection Time: 07/23/14  5:29 PM  Result Value Ref Range   Sodium 140 137 - 147 mEq/L   Potassium 3.6 (L) 3.7 - 5.3 mEq/L   Chloride 103 96 - 112 mEq/L   CO2 23 19 - 32 mEq/L   Glucose, Bld 176 (H) 70 - 99 mg/dL   BUN 15 6 - 23 mg/dL   Creatinine, Ser 1.16 (H) 0.50 - 1.10 mg/dL   Calcium 9.5 8.4 - 10.5 mg/dL   Total Protein 6.9 6.0 - 8.3 g/dL   Albumin 3.5 3.5 - 5.2 g/dL   AST 17 0 - 37 U/L   ALT 12 0 - 35 U/L   Alkaline Phosphatase 60 39 - 117 U/L   Total Bilirubin 0.2 (L) 0.3 - 1.2 mg/dL   GFR calc non Af Amer 53 (L) >90 mL/min   GFR calc Af Amer 62 (L) >90 mL/min   Anion gap 14 5 - 15  Urinalysis, Routine w reflex  microscopic     Status: Abnormal   Collection Time: 07/23/14  8:21 PM  Result Value Ref Range    Color, Urine YELLOW YELLOW   APPearance CLOUDY (A) CLEAR   Specific Gravity, Urine 1.015 1.005 - 1.030   pH 6.5 5.0 - 8.0   Glucose, UA NEGATIVE NEGATIVE mg/dL   Hgb urine dipstick LARGE (A) NEGATIVE   Bilirubin Urine NEGATIVE NEGATIVE   Ketones, ur NEGATIVE NEGATIVE mg/dL   Protein, ur NEGATIVE NEGATIVE mg/dL   Urobilinogen, UA 0.2 0.0 - 1.0 mg/dL   Nitrite NEGATIVE NEGATIVE   Leukocytes, UA NEGATIVE NEGATIVE  Urine microscopic-add on     Status: Abnormal   Collection Time: 07/23/14  8:21 PM  Result Value Ref Range   Squamous Epithelial / LPF RARE RARE   RBC / HPF 21-50 <3 RBC/hpf   Bacteria, UA FEW (A) RARE   Crystals CA OXALATE CRYSTALS (A) NEGATIVE   No results found for this or any previous visit (from the past 240 hour(s)).  Renal Function:  Recent Labs  07/22/14 1451 07/23/14 1729  CREATININE 0.84 1.16*   Estimated Creatinine Clearance: 48.5 mL/min (by C-G formula based on Cr of 1.16).  Radiologic Imaging: Ct Abdomen Pelvis W Contrast  07/22/2014   CLINICAL DATA:  Left lower quadrant abdominal pain with nausea, vomiting, and diarrhea today.  EXAM: CT ABDOMEN AND PELVIS WITH CONTRAST  TECHNIQUE: Multidetector CT imaging of the abdomen and pelvis was performed using the standard protocol following bolus administration of intravenous contrast.  CONTRAST:  169mL OMNIPAQUE IOHEXOL 300 MG/ML  SOLN  COMPARISON:  01/22/2010  FINDINGS: Mild dependent atelectasis in the lung bases. Bilateral breast implants.  Sub cm low-attenuation changes focally in the liver, likely representing small hemangiomas. The gallbladder, spleen, pancreas, adrenal glands, abdominal aorta, inferior vena cava, and retroperitoneal lymph nodes are unremarkable. There is left-sided hydronephrosis and hydroureter with a 3 mm stone demonstrated in the distal left ureter just above the ureterovesical junction. Mild edema around the left kidney. Stomach, small bowel, and colon appear unremarkable. Contrast  material flows through to the colon without evidence of obstruction. No free air or free fluid in the abdomen.  Pelvis: The appendix is normal. No free or loculated pelvic fluid collections. No pelvic mass or lymphadenopathy. Surgical clips in the pelvis. Rectosigmoid colon is unremarkable. Calcification inferior to the anterior bladder possibly representing a bladder wall calcification. No destructive bone lesions.  IMPRESSION: 3 mm stone in the distal left ureter with moderate proximal obstruction.   Electronically Signed   By: Lucienne Capers M.D.   On: 07/22/2014 21:18    Impression/Assessment:  53 yo with left distal obstructing stone and intractable nausea and vomiting.  No concern for infection at this time.  Plan:  Will post for left ureteroscopy and stone extraction, left ureteral stent placement

## 2014-07-24 NOTE — Plan of Care (Signed)
Problem: Phase I Progression Outcomes Goal: Tubes/drains patent Outcome: Completed/Met Date Met:  07/24/14

## 2014-07-24 NOTE — Transfer of Care (Signed)
Immediate Anesthesia Transfer of Care Note  Patient: [name]  Procedure(s) Performed: Procedure(s) (LRB): CYSTOSCOPY WITH STENT PLACEMENT (Left) cYSTOSCOPY WITH RETROGRADE PYELOGRAM dilation left ureter basket extraction left ureteral stone left double j stent (Left)  Patient Location: PACU  Anesthesia Type: General  Level of Consciousness: sedated, patient cooperative and responds to stimulation  Airway & Oxygen Therapy: Patient Spontanous Breathing and Patient connected to face mask oxgen  Post-op Assessment: Report given to PACU RN and Post -op Vital signs reviewed and stable  Post vital signs: Reviewed and stable  Complications: No apparent anesthesia complications

## 2014-07-24 NOTE — Progress Notes (Signed)
UR completed 

## 2014-07-24 NOTE — Plan of Care (Signed)
Problem: Phase I Progression Outcomes Goal: Sutures/staples intact Outcome: Not Applicable Date Met:  07/24/14     

## 2014-07-24 NOTE — ED Notes (Signed)
Urology being paged

## 2014-07-24 NOTE — Op Note (Signed)
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  Surgeon:  Chauncey Cruel. Gaynelle Arabian, MD  First assistant: Gypsy Lore M.D.  Anesthesia:  Gen. LMA  Preparation: after appropriate preanesthesia, the patient was brought to the operative room, placed on the operating table in the dorsal supine position where general LMA anesthesia was introduced. She was then replaced in the dorsal lithotomy position with pubis was prepped with Betadine solution and draped in usual fashion. B&O suppository was placed. CT scan was reviewed. History was reviewed. Arm band was double checked.  Review history:  Dana Sanders is a 53 y.o. female who presents with intractable nausea and vomiting in the setting of left sided flank pain and a distal ureteral stone. She was first diagnosed with this two days ago when she presented to the ER for evaluation. She was discharged on medical expulsive therapy but returned to the ER this evening with continued nausea and vomiting.   In the ER she was found to have stable vital signs and no concern for infection. WBC = 9.8 and Cr = 1.16.    Statement of  Likelihood of Success: Excellent. TIME-OUT observed.:  Procedure: cystourethroscopy was accomplished, which showed normal-appearing urethra, and normal bladder base. Ureteral orifices were identified bilaterally. The left ureteral orifice was not able to be cannulated with a 5 open-ended catheter. Therefore a 0.038 Sensor guidewire was passed through the ureteral orifice, and under fluoroscopic control, into the kidney. Using the ureteral access sheath dilators, the ureteral orifice and  intramural ureter were dilated with size 12 and 14 dilators. The short ureteroscope was then placed, and the stone identified, photographed. Stone basket was then placed, and the stone extracted. Retrograde pyelogram revealed hydronephrotic ureter above the level of the stone, but no further stones were identified within the ureter. Double-J catheter was then passed without difficulty over the guidewire, and coiled in the kidney, and in the bladder. The 4.8 [place] by 24 cm stent had the suture remaining intact, and was taped postoperatively to the pubis.  The patient received 1 g of IV Tylenol, and 30 mg of IV Toradol during the procedure. She was awakened, and taken to recovery room in good condition.

## 2014-07-24 NOTE — ED Notes (Signed)
Pt going to OR this morning, pt has hospital gown on, jewelry off and in bag, consent signed.

## 2014-07-25 ENCOUNTER — Encounter (HOSPITAL_COMMUNITY): Payer: Self-pay | Admitting: Urology

## 2014-07-25 MED ORDER — CIPROFLOXACIN HCL 500 MG PO TABS: 500.0000 mg | ORAL_TABLET | Freq: Two times a day (BID) | ORAL | Status: DC

## 2014-07-25 MED ORDER — PHENAZOPYRIDINE HCL 200 MG PO TABS: 200.0000 mg | ORAL_TABLET | Freq: Three times a day (TID) | ORAL | Status: DC | PRN

## 2014-07-25 MED ORDER — OXYCODONE-ACETAMINOPHEN 5-325 MG PO TABS: 1.0000 | ORAL_TABLET | ORAL | Status: DC | PRN

## 2014-07-25 NOTE — Discharge Instructions (Addendum)
DISCHARGE INSTRUCTIONS FOR KIDNEY STONES OR URETERAL STENT ° °MEDICATIONS:  ° °1. DO NOT RESUME YOUR ASPIRIN, or any other medicines like ibuprofen, motrin, excedrin, advil, aleve, vitamin E, fish oil as these can all cause bleeding x 7 days. ° °2. Resume all your other meds from home - except do not take any other pain meds that you may have at home. ° °ACTIVITY °1. No strenuous activity x 1week °2. No driving while on narcotic pain medications °3. Drink plenty of water °4. Continue to walk at home - you can still get blood clots when you are at home, so keep active, but don't over do it. °5. May return to work in 3 days. ° °BATHING °1. You can shower and we recommend daily showers  °2. If you have a string coming from your urethra:  The stent string is attached to your ureteral stent.  Do not pull on this. ° ° °SIGNS/SYMPTOMS TO CALL: °1. Please call us if you have a fever greater than 101.5, uncontrolled  °nausea/vomiting, uncontrolled pain, dizziness, unable to urinate, bloody urine, chest pain, shortness of breath, leg swelling, leg pain, redness around wound, drainage from wound, or any other concerns or questions. ° °You can reach us at [phone]. ° °FOLLOW-UP °You have an appointment: call [phone] for appointment for stent removal.  °  You may feel an odd sensation in your back. OK for occasional blood in the urine.  °

## 2014-07-25 NOTE — Progress Notes (Signed)
Urology Progress Note  1 Day Post-Op   Subjective: Post ureteral dilation and basket extraction stone.     No acute urologic events overnight. Ambulation:   positive Flatus:    positive Bowel movement  positive  Pain: complete resolution, but pt is complaining of general muscle sorenesss  Objective:  Blood pressure 110/77, pulse 86, temperature 98 F (36.7 C), temperature source Oral, resp. rate 16, height 5\' 1"  (1.549 m), weight 64.8 kg (142 lb 13.7 oz), SpO2 98 %.  Physical Exam:  General:  No acute distress, awake Extremities: extremities normal, atraumatic, no cyanosis or edema Genitourinary:   Normal bladder Foley: out    I/O last 3 completed shifts: In: 4696.7 [P.O.:480; I.V.:4016.7; IV Piggyback:200] Out: 1650 [[id number]]  Recent Labs     07/22/14  1451  07/23/14  1729  HGB  13.0  12.7  WBC  11.2*  9.8  PLT  266  242    Recent Labs     07/22/14  1451  07/23/14  1729  NA  143  140  K  4.0  3.6*  CL  103  103  CO2  24  23  BUN  19  15  CREATININE  0.84  1.16*  CALCIUM  9.7  9.5  GFRNONAA  79*  53*  GFRAA  >90  62*     No results for input(s): INR, APTT in the last 72 hours.  Invalid input(s): PT   Invalid input(s): ABG  Assessment/Plan:  Pt is to increase activities as tolerated. Remove J J stent today and D/C.

## 2014-07-25 NOTE — Discharge Summary (Signed)
  Physician Discharge Summary  Patient ID: Dana Sanders MRN: [id number] DOB/AGE: [birth year] 53 y.o.  Admit date: 07/23/2014 Discharge date: 07/25/2014  Admission Diagnoses: Ureteral colic [Z60] Intractable pain [R52]  Discharge Diagnoses:  Active Problems:   Nephrolithiasis   Ureteral stone with hydronephrosis   Discharged Condition: stable}  Hospital Course: surgery: Left ureteral dilation and basket extraction of lower ureteral stone.   Significant Diagnostic Studies: No results found.  Discharge Exam: Blood pressure 110/77, pulse 86, temperature 98 F (36.7 C), temperature source Oral, resp. rate 16, height 5\' 1"  (1.549 m), weight 64.8 kg (142 lb 13.7 oz), SpO2 98 %.   Disposition: 01-Home or Self Care  Discharge Instructions    Discharge patient    Complete by:  As directed      Discontinue IV    Complete by:  As directed             Medication List    TAKE these medications        albuterol 108 (90 BASE) MCG/ACT inhaler  Commonly known as:  PROVENTIL HFA;VENTOLIN HFA  Inhale 2 puffs into the lungs every 6 (six) hours as needed for wheezing or shortness of breath.     ciprofloxacin 500 MG tablet  Commonly known as:  CIPRO  Take 1 tablet (500 mg total) by mouth 2 (two) times daily.     doxycycline 50 MG capsule  Commonly known as:  VIBRAMYCIN  Take 2 capsules (100 mg total) by mouth 2 (two) times daily.     naproxen 500 MG tablet  Commonly known as:  NAPROSYN  Take 1 tablet (500 mg total) by mouth 2 (two) times daily as needed for mild pain, moderate pain or headache (TAKE WITH MEALS.).     ondansetron 4 MG disintegrating tablet  Commonly known as:  ZOFRAN ODT  Take 1 tablet (4 mg total) by mouth every 8 (eight) hours as needed for nausea or vomiting.     oxyCODONE-acetaminophen 5-325 MG per tablet  Commonly known as:  PERCOCET/ROXICET  One of two every 4 to 6 hours  as needed for pain     oxyCODONE-acetaminophen 5-325 MG per tablet  Commonly  known as:  PERCOCET  Take 1-2 tablets by mouth every 6 (six) hours as needed for severe pain.     oxyCODONE-acetaminophen 5-325 MG per tablet  Commonly known as:  ROXICET  Take 1 tablet by mouth every 4 (four) hours as needed for severe pain.     PARoxetine 12.5 MG 24 hr tablet  Commonly known as:  PAXIL-CR  Take 12.5 mg by mouth every morning.     phenazopyridine 200 MG tablet  Commonly known as:  PYRIDIUM  Take 1 tablet (200 mg total) by mouth 3 (three) times daily as needed for pain.     tamsulosin 0.4 MG Caps capsule  Commonly known as:  FLOMAX  Take 1 capsule (0.4 mg total) by mouth daily after supper.     traMADol 50 MG tablet  Commonly known as:  ULTRAM  Take 50 mg by mouth every 6 (six) hours as needed.           Follow-up Information    Follow up with Ailene Rud, MD.   Specialty:  Urology   Contact information:   [place] [address] [phone]     JJ stent removed prior to discharge.  Percocet Cipro  SignedGaynelle Arabian, Nattie Lazenby I 07/25/2014, 10:31 AM

## 2014-07-25 NOTE — Progress Notes (Signed)
Patient discharged to home with friend via wheelchair, discharge instructions reviewed with patient who verbalized understanding. New RX's given to patient.

## 2014-07-30 ENCOUNTER — Encounter (HOSPITAL_COMMUNITY): Payer: Self-pay | Admitting: Urology

## 2014-07-30 NOTE — Addendum Note (Signed)
Addendum  created 07/30/14 1337 by Maxwell Caul, CRNA   Modules edited: Anesthesia Events, Narrator, Narrator Events   Narrator:  Narrator: Event Log Edited   Narrator Events:  Delete Quick Note event

## 2014-08-01 ENCOUNTER — Encounter (HOSPITAL_COMMUNITY): Payer: Self-pay | Admitting: Urology

## 2014-11-14 ENCOUNTER — Encounter: Payer: Self-pay | Admitting: Women's Health

## 2014-11-14 ENCOUNTER — Ambulatory Visit (INDEPENDENT_AMBULATORY_CARE_PROVIDER_SITE_OTHER): Payer: 59 | Admitting: Women's Health

## 2014-11-14 VITALS — BP 112/88 | Ht 61.0 in | Wt 143.0 lb

## 2014-11-14 DIAGNOSIS — Z79890 Hormone replacement therapy (postmenopausal): Secondary | ICD-10-CM

## 2014-11-14 MED ORDER — ESTRADIOL 10 MCG VA TABS: ORAL_TABLET | VAGINAL | Status: DC

## 2014-11-14 MED ORDER — ESTRADIOL 0.05 MG/24HR TD PTWK: 0.0500 mg | MEDICATED_PATCH | TRANSDERMAL | Status: DC

## 2014-11-14 NOTE — Patient Instructions (Signed)
replens vag lubricant  Hormone Therapy At menopause, your body begins making less estrogen and progesterone hormones. This causes the body to stop having menstrual periods. This is because estrogen and progesterone hormones control your periods and menstrual cycle. A lack of estrogen may cause symptoms such as:  Hot flushes (or hot flashes).  Vaginal dryness.  Dry skin.  Loss of sex drive.  Risk of bone loss (osteoporosis). When this happens, you may choose to take hormone therapy to get back the estrogen lost during menopause. When the hormone estrogen is given alone, it is usually referred to as ET (Estrogen Therapy). When the hormone progestin is combined with estrogen, it is generally called HT (Hormone Therapy). This was formerly known as hormone replacement therapy (HRT). Your caregiver can help you make a decision on what will be best for you. The decision to use HT seems to change often as new studies are done. Many studies do not agree on the benefits of hormone replacement therapy. LIKELY BENEFITS OF HT INCLUDE PROTECTION FROM:  Hot Flushes (also called hot flashes) - A hot flush is a sudden feeling of heat that spreads over the face and body. The skin may redden like a blush. It is connected with sweats and sleep disturbance. Women going through menopause may have hot flushes a few times a month or several times per day depending on the woman.  Osteoporosis (bone loss)- Estrogen helps guard against bone loss. After menopause, a woman's bones slowly lose calcium and become weak and brittle. As a result, bones are more likely to break. The hip, wrist, and spine are affected most often. Hormone therapy can help slow bone loss after menopause. Weight bearing exercise and taking calcium with vitamin D also can help prevent bone loss. There are also medications that your caregiver can prescribe that can help prevent osteoporosis.  Vaginal Dryness - Loss of estrogen causes changes in the  vagina. Its lining may become thin and dry. These changes can cause pain and bleeding during sexual intercourse. Dryness can also lead to infections. This can cause burning and itching. (Vaginal estrogen treatment can help relieve pain, itching, and dryness.)  Urinary Tract Infections are more common after menopause because of lack of estrogen. Some women also develop urinary incontinence because of low estrogen levels in the vagina and bladder.  Possible other benefits of estrogen include a positive effect on mood and short-term memory in women. RISKS AND COMPLICATIONS  Using estrogen alone without progesterone causes the lining of the uterus to grow. This increases the risk of lining of the uterus (endometrial) cancer. Your caregiver should give another hormone called progestin if you have a uterus.  Women who take combined (estrogen and progestin) HT appear to have an increased risk of breast cancer. The risk appears to be small, but increases throughout the time that HT is taken.  Combined therapy also makes the breast tissue slightly denser which makes it harder to read mammograms (breast X-rays).  Combined, estrogen and progesterone therapy can be taken together every day, in which case there may be spotting of blood. HT therapy can be taken cyclically in which case you will have menstrual periods. Cyclically means HT is taken for a set amount of days, then not taken, then this process is repeated.  HT may increase the risk of stroke, heart attack, breast cancer and forming blood clots in your leg.  Transdermal estrogen (estrogen that is absorbed through the skin with a patch or a cream) may have  more positive results with:  Cholesterol.  Blood pressure.  Blood clots. Having the following conditions may indicate you should not have HT:  Endometrial cancer.  Liver disease.  Breast cancer.  Heart disease.  History of blood clots.  Stroke. TREATMENT   If you choose to take  HT and have a uterus, usually estrogen and progestin are prescribed.  Your caregiver will help you decide the best way to take the medications.  Possible ways to take estrogen include:  Pills.  Patches.  Gels.  Sprays.  Vaginal estrogen cream, rings and tablets.  It is best to take the lowest dose possible that will help your symptoms and take them for the shortest period of time that you can.  Hormone therapy can help relieve some of the problems (symptoms) that affect women at menopause. Before making a decision about HT, talk to your caregiver about what is best for you. Be well informed and comfortable with your decisions. HOME CARE INSTRUCTIONS   Follow your caregivers advice when taking the medications.  A Pap test is done to screen for cervical cancer.  The first Pap test should be done at age 71.  Between ages 36 and 24, Pap tests are repeated every 2 years.  Beginning at age 31, you are advised to have a Pap test every 3 years as long as your past 3 Pap tests have been normal.  Some women have medical problems that increase the chance of getting cervical cancer. Talk to your caregiver about these problems. It is especially important to talk to your caregiver if a new problem develops soon after your last Pap test. In these cases, your caregiver may recommend more frequent screening and Pap tests.  The above recommendations are the same for women who have or have not gotten the vaccine for HPV (Human Papillomavirus).  If you had a hysterectomy for a problem that was not a cancer or a condition that could lead to cancer, then you no longer need Pap tests. However, even if you no longer need a Pap test, a regular exam is a good idea to make sure no other problems are starting.   If you are between ages 54 and 55, and you have had normal Pap tests going back 10 years, you no longer need Pap tests. However, even if you no longer need a Pap test, a regular exam is a good  idea to make sure no other problems are starting.   If you have had past treatment for cervical cancer or a condition that could lead to cancer, you need Pap tests and screening for cancer for at least 20 years after your treatment.  If Pap tests have been discontinued, risk factors (such as a new sexual partner) need to be re-assessed to determine if screening should be resumed.  Some women may need screenings more often if they are at high risk for cervical cancer.  Get mammograms done as per the advice of your caregiver. SEEK IMMEDIATE MEDICAL CARE IF:  You develop abnormal vaginal bleeding.  You have pain or swelling in your legs, shortness of breath, or chest pain.  You develop dizziness or headaches.  You have lumps or changes in your breasts or armpits.  You have slurred speech.  You develop weakness or numbness of your arms or legs.  You have pain, burning, or bleeding when urinating.  You develop abdominal pain. Document Released: 06/05/2003 Document Revised: 11/29/2011 Document Reviewed: 09/23/2010 [facility] Patient Information 2015 [place], [place]. This  information is not intended to replace advice given to you by your health care provider. Make sure you discuss any questions you have with your health care provider.  

## 2014-11-14 NOTE — Progress Notes (Signed)
Patient ID: [name], female   DOB: 1961-04-14, 54 y.o.   MRN: [id number] Presents with problem of numerous menopausal symptoms. Had tried Paxil for hot flash relief with some relief but had weight gain. 2001 TVH for DUB, menorrhagia. Hot flushes started about age 59 and have increased, poor sleep and extreme vaginal dryness making intercourse difficult. 07/2014 had a normal CBC, glucose. Reports normal Pap and mammogram history. Same partner, denies need for STD screen. Has had a problem with a persistent cough has follow-up scheduled, kidney stone 07/2014. Increased stress at home helping to care for 64 year old father who has dementia. Minimal family support, currently in counseling for family issues. Has a 3 year old son doing well- policeman and has a son. Daughter died of leukemia. October 01, 2012 benign biopsy of a goiter.  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 Reports normal Pap and mammogram history.  Exam: Appears well. Heart regular rate and rhythm, lungs clear throughout no cough noted.  Menopausal symptoms Family stress TVH for menorrhagia  Plan: Options reviewed, reviewed risks of HRT blood clots, strokes, breast cancer, women's health initiative, use shortest amount of time possible for symptom relief. Will try  estradiol 0.05 patch weekly prescription, proper use given and reviewed will evaluate at annual . Vagifem 1 applicator per vagina for 2 weeks and then twice weekly after. Reviewed Vagifem has minimal systemic absorption but may help vaginal dryness. Also encouraged lubricants or Replens. Mammogram due in April, history of dense breasts encouraged 3-D tomography. Instructed to return in May for annual exam and will evaluate symptoms.

## 2014-11-26 ENCOUNTER — Other Ambulatory Visit: Payer: Self-pay

## 2014-11-26 DIAGNOSIS — Z1231 Encounter for screening mammogram for malignant neoplasm of breast: Secondary | ICD-10-CM

## 2014-12-30 ENCOUNTER — Ambulatory Visit
Admission: RE | Admit: 2014-12-30 | Discharge: 2014-12-30 | Disposition: A | Discharge status: Home / Self Care | Payer: 59 | Source: Ambulatory Visit

## 2014-12-30 DIAGNOSIS — Z1231 Encounter for screening mammogram for malignant neoplasm of breast: Secondary | ICD-10-CM

## 2015-01-01 ENCOUNTER — Encounter: Payer: Self-pay | Admitting: Women's Health

## 2015-01-16 ENCOUNTER — Ambulatory Visit (INDEPENDENT_AMBULATORY_CARE_PROVIDER_SITE_OTHER): Payer: 59 | Admitting: Gynecology

## 2015-01-16 ENCOUNTER — Encounter: Payer: Self-pay | Admitting: Gynecology

## 2015-01-16 ENCOUNTER — Telehealth: Payer: Self-pay | Admitting: *Deleted

## 2015-01-16 VITALS — BP 108/70

## 2015-01-16 DIAGNOSIS — N951 Menopausal and female climacteric states: Secondary | ICD-10-CM

## 2015-01-16 DIAGNOSIS — N9089 Other specified noninflammatory disorders of vulva and perineum: Secondary | ICD-10-CM | POA: Diagnosis not present

## 2015-01-16 NOTE — Progress Notes (Signed)
   Patient is a 54 year old who stated that approximately 2 years ago she entered the menopause and is suffer from hot flashes, irritability, mood swings but has been adamant of starting hormone replacement therapy. She has a history of prior transvaginal hysterectomy as a result of menorrhagia. She saw our nurse practitioner Elon Alas a few weeks ago she had prescribed her transdermal estrogen but patient has not picked up the prescription. She is here today complaining of very sensitive tender irritated area on her external genitalia. She is infrequently sexually active with her partner. She denies any discharge.  Exam: Blood pressure 108/70 Gen. 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 She'll be treated with topical clindamycin cream half to 1 applicator vaginally and external genitalia before bedtime for 2 weeks. The superior portion mildly leukoplakic highly suspicious for lichen sclerosis. Since patient is very tender in the area of the fourchette we will have her return in 2 weeks for follow-up and then biopsy that area and treatment with clobetasol accordingly. Also because of her vaginal atrophy we'll discuss in the near future begin using Vagifem 10 g 2 times a week. We went through a lengthy discussion of the women's health initiative study. She has had a prior hysterectomy.

## 2015-01-16 NOTE — Patient Instructions (Signed)
Lichen Sclerosus Lichen sclerosus is a skin problem. It can happen on any part of the body, but it commonly involves the anal or genital areas. Lichen sclerosus is not an infection or a fungus. Girls and women are more commonly affected than boys and men. CAUSES The cause is not known. It could be the result of an overactive immune system or a lack of certain hormones. Lichen sclerosus is not passed from one person to another (not contagious). SYMPTOMS Your skin may have:  Thin, wrinkled, white areas.  Thickened white areas.  Red and swollen patches.  Tears or cracks.  Bruising.  Blood blisters.  Severe itching. You may also have pain, itching, or burning with urination. Constipation is also common in people with lichen sclerosus. DIAGNOSIS Your caregiver will do a physical exam. Sometimes, a tissue sample (biopsy) may be sent for testing. TREATMENT Treatment may involve putting a thin layer of medicated cream (topical steroid) over the areas with lichen sclerosus. Use the cream only as directed by your caregiver.  HOME CARE INSTRUCTIONS  Only take over-the-counter or prescription medicines as directed by your caregiver.  Keep the vaginal area as clean and dry as possible. SEEK MEDICAL CARE IF: You develop increasing pain, swelling, or redness. Document Released: 01/27/2011 Document Revised: 11/29/2011 Document Reviewed: 01/27/2011 [employer] Patient Information 2015 [employer]. This information is not intended to replace advice given to you by your health care provider. Make sure you discuss any questions you have with your health care provider.  

## 2015-01-16 NOTE — Telephone Encounter (Signed)
error 

## 2015-01-29 ENCOUNTER — Encounter: Payer: 59 | Admitting: Women's Health

## 2015-02-04 ENCOUNTER — Encounter: Payer: Self-pay | Admitting: Gynecology

## 2015-02-04 ENCOUNTER — Ambulatory Visit (INDEPENDENT_AMBULATORY_CARE_PROVIDER_SITE_OTHER): Payer: 59 | Admitting: Gynecology

## 2015-02-04 ENCOUNTER — Telehealth: Payer: Self-pay

## 2015-02-04 VITALS — BP 106/70

## 2015-02-04 DIAGNOSIS — Z780 Asymptomatic menopausal state: Secondary | ICD-10-CM

## 2015-02-04 DIAGNOSIS — N952 Postmenopausal atrophic vaginitis: Secondary | ICD-10-CM

## 2015-02-04 DIAGNOSIS — N9089 Other specified noninflammatory disorders of vulva and perineum: Secondary | ICD-10-CM

## 2015-02-04 NOTE — Progress Notes (Signed)
    patient is a 54 year old who was seen in the office on 01/16/2015 with her history as follows:  approximately 2 years ago she entered the menopause and is suffer from hot flashes, irritability, mood swings but has been adamant of starting hormone replacement therapy. She has a history of prior transvaginal hysterectomy as a result of menorrhagia. She saw our nurse practitioner Elon Alas a few weeks ago she had prescribed her transdermal estrogen but patient has not picked up the prescription. She was complaining of very sensitive tender irritated area on the external genitalia. She is sexually active but very infrequently with same partner 2 years. No prior history of any STDs.  On examination she was noted to have the following:  Physical Exam  Genitourinary:     The working diagnosis was an erosive vaginitis highly suspicious for inflammatory reaction and for this reason she was started on clindamycin cream which she applied vaginally for 2 weeks and is here for follow-up.  The fourchette area not as erythematous as before starting the clindamycin cream and the white area noted at the upper half of the external genitalia still suspicious for lichen sclerosis. Both of these areas were cleansed with Betadine solution and 1% lidocaine was infiltrated and biopsy was obtained from the upper portion of the left labia majora and also the inferior portion of the right labia minora near the fourchette. I explained to the patient that this biopsy is needed be done to rule out malignancy. For her vaginal atrophy she will be prescribed Vagifem 10 g to apply twice a week. The risk benefits and pros and cons were discussed. If indeed the pathology report demonstrates that it is lichen sclerosus she will be called in clobetasol to apply daily for one week and then twice a week thereafter. Information provided in written format and all pledgets are answered.

## 2015-02-04 NOTE — Telephone Encounter (Signed)
Patient saw you today and wanted to ask you if she still needed to keep her RG[facility] appt with [place] scheduled on 02/10/15?  I looked at your notes and it does not look like you did complete exam/annual.

## 2015-02-04 NOTE — Patient Instructions (Addendum)
VULVAR BIOPSY POST-PROCEDURE INSTRUCTIONS  1. You may take Ibuprofen, Aleve or Tylenol for pain if needed.    2. You may have a small amount of spotting.  You should wear a mini pad for the next few days.  3. You may use some topical Neosporin ointment if you would like (over the counter is fine).  4. You need to call if you have redness around the biopsy site, if there is any unusual draining, if the bleeding is heavy, or if you are concerned.  5. Shower or bathe as normal  We will call you within one week with results or we will discuss the results at your follow-up appointment if needed.Estradiol vaginal tablets What is this medicine? ESTRADIOL (es tra DYE ole) vaginal tablet is used to help relieve symptoms of vaginal irritation and dryness that occurs in some women during menopause. This medicine may be used for other purposes; ask your health care provider or pharmacist if you have questions. COMMON BRAND NAME(S): Vagifem What should I tell my health care provider before I take this medicine? They need to know if you have any of these conditions: -abnormal vaginal bleeding -blood vessel disease or blood clots -breast, cervical, endometrial, ovarian, liver, or uterine cancer -dementia -diabetes -gallbladder disease -heart disease or recent heart attack -high blood pressure -high cholesterol -high level of calcium in the blood -hysterectomy -kidney disease -liver disease -migraine headaches -protein C deficiency -protein S deficiency -stroke -systemic lupus erythematosus (SLE) -tobacco smoker -an unusual or allergic reaction to estrogens, other hormones, medicines, foods, dyes, or preservatives -pregnant or trying to get pregnant -breast-feeding How should I use this medicine? This medicine is only for use in the vagina. Do not take by mouth. Wash your hands before and after use. Read package directions carefully. Unwrap the pre-filled applicator package. Lie on your  back, part and bend your knees. Gently insert the applicator tip high in the vagina and push the plunger to release the tablet into the vagina. Gently remove the applicator. Throw away the applicator after use. Do not use your medicine more often than directed. Finish the full course prescribed by your doctor or health care professional even if you think your condition is better. Do not stop using except on the advice of your doctor or health care professional. Talk to your pediatrician regarding the use of this medicine in children. A patient package insert for the product will be given with each prescription and refill. Read this sheet carefully each time. The sheet may change frequently. Overdosage: If you think you have taken too much of this medicine contact a poison control center or emergency room at once. NOTE: This medicine is only for you. Do not share this medicine with others. What if I miss a dose? If you miss a dose, take it as soon as you can. If it is almost time for your next dose, take only that dose. Do not take double or extra doses. What may interact with this medicine? Do not take this medicine with any of the following medications: -aromatase inhibitors like aminoglutethimide, anastrozole, exemestane, letrozole, testolactone This medicine may also interact with the following medications: -antibiotics used to treat tuberculosis like rifabutin, rifampin and rifapentene -raloxifene or tamoxifen -warfarin This list may not describe all possible interactions. Give your health care provider a list of all the medicines, herbs, non-prescription drugs, or dietary supplements you use. Also tell them if you smoke, drink alcohol, or use illegal drugs. Some items may interact with  your medicine. What should I watch for while using this medicine? Visit your health care professional for regular checks on your progress. You will need a regular breast and pelvic exam. You should also discuss  the need for regular mammograms with your health care professional, and follow his or her guidelines. This medicine can make your body retain fluid, making your fingers, hands, or ankles swell. Your blood pressure can go up. Contact your doctor or health care professional if you feel you are retaining fluid. If you have any reason to think you are pregnant; stop taking this medicine at once and contact your doctor or health care professional. Tobacco smoking increases the risk of getting a blood clot or having a stroke, especially if you are more than 53 years old. You are strongly advised not to smoke. If you wear contact lenses and notice visual changes, or if the lenses begin to feel uncomfortable, consult your eye care specialist. If you are going to have elective surgery, you may need to stop taking this medicine beforehand. Consult your health care professional for advice prior to scheduling the surgery. What side effects may I notice from receiving this medicine? Side effects that you should report to your doctor or health care professional as soon as possible: -allergic reactions like skin rash, itching or hives, swelling of the face, lips, or tongue -breast tissue changes or discharge -changes in vision -chest pain -confusion, trouble speaking or understanding -dark urine -general ill feeling or flu-like symptoms -light-colored stools -nausea, vomiting -pain, swelling, warmth in the leg -right upper belly pain -severe headaches -shortness of breath -sudden numbness or weakness of the face, arm or leg -trouble walking, dizziness, loss of balance or coordination -unusual vaginal bleeding -yellowing of the eyes or skin Side effects that usually do not require medical attention (report to your doctor or health care professional if they continue or are bothersome): -hair loss -increased hunger or thirst -increased urination -symptoms of vaginal infection like itching, irritation or  unusual discharge -unusually weak or tired This list may not describe all possible side effects. Call your doctor for medical advice about side effects. You may report side effects to FDA at 1-800-FDA-1088. Where should I keep my medicine? Keep out of the reach of children. Store at room temperature between 15 and 30 degrees C (59 and 86 degrees F). Throw away any unused medicine after the expiration date. NOTE: This sheet is a summary. It may not cover all possible information. If you have questions about this medicine, talk to your doctor, pharmacist, or health care provider.  2015, [employer]. ([id number]:[id number]) Lichen Sclerosus Lichen sclerosus is a skin problem. It can happen on any part of the body, but it commonly involves the anal or genital areas. Lichen sclerosus is not an infection or a fungus. Girls and women are more commonly affected than boys and men. CAUSES The cause is not known. It could be the result of an overactive immune system or a lack of certain hormones. Lichen sclerosus is not passed from one person to another (not contagious). SYMPTOMS Your skin may have:  Thin, wrinkled, white areas.  Thickened white areas.  Red and swollen patches.  Tears or cracks.  Bruising.  Blood blisters.  Severe itching. You may also have pain, itching, or burning with urination. Constipation is also common in people with lichen sclerosus. DIAGNOSIS Your caregiver will do a physical exam. Sometimes, a tissue sample (biopsy) may be sent for testing. TREATMENT Treatment may involve  putting a thin layer of medicated cream (topical steroid) over the areas with lichen sclerosus. Use the cream only as directed by your caregiver.  HOME CARE INSTRUCTIONS  Only take over-the-counter or prescription medicines as directed by your caregiver.  Keep the vaginal area as clean and dry as possible. SEEK MEDICAL CARE IF: You develop increasing pain, swelling, or  redness. Document Released: 01/27/2011 Document Revised: 11/29/2011 Document Reviewed: 01/27/2011 [facility] Patient Information 2015 [place], [place]. This information is not intended to replace advice given to you by your health care provider. Make sure you discuss any questions you have with your health care provider. Menopause Menopause is the normal time of life when menstrual periods stop completely. Menopause is complete when you have missed 12 consecutive menstrual periods. It usually occurs between the ages of 71 years and 56 years. Very rarely does a woman develop menopause before the age of 24 years. At menopause, your ovaries stop producing the female hormones estrogen and progesterone. This can cause undesirable symptoms and also affect your health. Sometimes the symptoms may occur 4-5 years before the menopause begins. There is no relationship between menopause and:  Oral contraceptives.  Number of children you had.  Race.  The age your menstrual periods started (menarche). Heavy smokers and very thin women may develop menopause earlier in life. CAUSES  The ovaries stop producing the female hormones estrogen and progesterone.  Other causes include:  Surgery to remove both ovaries.  The ovaries stop functioning for no known reason.  Tumors of the pituitary gland in the brain.  Medical disease that affects the ovaries and hormone production.  Radiation treatment to the abdomen or pelvis.  Chemotherapy that affects the ovaries. SYMPTOMS   Hot flashes.  Night sweats.  Decrease in sex drive.  Vaginal dryness and thinning of the vagina causing painful intercourse.  Dryness of the skin and developing wrinkles.  Headaches.  Tiredness.  Irritability.  Memory problems.  Weight gain.  Bladder infections.  Hair growth of the face and chest.  Infertility. More serious symptoms include:  Loss of bone (osteoporosis) causing breaks  (fractures).  Depression.  Hardening and narrowing of the arteries (atherosclerosis) causing heart attacks and strokes. DIAGNOSIS   When the menstrual periods have stopped for 12 straight months.  Physical exam.  Hormone studies of the blood. TREATMENT  There are many treatment choices and nearly as many questions about them. The decisions to treat or not to treat menopausal changes is an individual choice made with your health care provider. Your health care provider can discuss the treatments with you. Together, you can decide which treatment will work best for you. Your treatment choices may include:   Hormone therapy (estrogen and progesterone).  Non-hormonal medicines.  Treating the individual symptoms with medicine (for example antidepressants for depression).  Herbal medicines that may help specific symptoms.  Counseling by a psychiatrist or psychologist.  Group therapy.  Lifestyle changes including:  Eating healthy.  Regular exercise.  Limiting caffeine and alcohol.  Stress management and meditation.  No treatment. HOME CARE INSTRUCTIONS   Take the medicine your health care provider gives you as directed.  Get plenty of sleep and rest.  Exercise regularly.  Eat a diet that contains calcium (good for the bones) and soy products (acts like estrogen hormone).  Avoid alcoholic beverages.  Do not smoke.  If you have hot flashes, dress in layers.  Take supplements, calcium, and vitamin D to strengthen bones.  You can use over-the-counter lubricants or moisturizers  for vaginal dryness.  Group therapy is sometimes very helpful.  Acupuncture may be helpful in some cases. SEEK MEDICAL CARE IF:   You are not sure you are in menopause.  You are having menopausal symptoms and need advice and treatment.  You are still having menstrual periods after age 62 years.  You have pain with intercourse.  Menopause is complete (no menstrual period for 12 months)  and you develop vaginal bleeding.  You need a referral to a specialist (gynecologist, psychiatrist, or psychologist) for treatment. SEEK IMMEDIATE MEDICAL CARE IF:   You have severe depression.  You have excessive vaginal bleeding.  You fell and think you have a broken bone.  You have pain when you urinate.  You develop leg or chest pain.  You have a fast pounding heart beat (palpitations).  You have severe headaches.  You develop vision problems.  You feel a lump in your breast.  You have abdominal pain or severe indigestion. Document Released: 11/27/2003 Document Revised: 05/09/2013 Document Reviewed: 04/05/2013 [facility] Patient Information 2015 [place], [place]. This information is not intended to replace advice given to you by your health care provider. Make sure you discuss any questions you have with your health care provider.

## 2015-02-04 NOTE — Telephone Encounter (Signed)
Have her change it to the end of next month so week and wait for the result of her biopsy from today and initiate treatment and this will be a good follow-up at the time of her annual at the end of next month

## 2015-02-05 NOTE — Telephone Encounter (Signed)
Left detailed message on her voice mail per [facility] access note.

## 2015-02-06 ENCOUNTER — Telehealth: Payer: Self-pay | Admitting: *Deleted

## 2015-02-06 MED ORDER — TRAMADOL HCL 50 MG PO TABS: 50.0000 mg | ORAL_TABLET | Freq: Four times a day (QID) | ORAL | Status: DC | PRN

## 2015-02-06 NOTE — Telephone Encounter (Signed)
Pt had C&B on 02/04/15 2 biopsy done works in [place] [facility] [employer] asked if you could give her something for the discomfort? Pt has tired the OTC and no help. Please advise

## 2015-02-06 NOTE — Telephone Encounter (Signed)
Rx called in, left on pt voicemail Rx has been called in

## 2015-02-06 NOTE — Telephone Encounter (Signed)
Ultram 50 mg 1 PO Q  6 hours PRN # 30

## 2015-02-07 ENCOUNTER — Encounter: Payer: Self-pay | Admitting: Gynecology

## 2015-02-07 ENCOUNTER — Other Ambulatory Visit: Payer: Self-pay | Admitting: Gynecology

## 2015-02-07 MED ORDER — CLOBETASOL PROPIONATE 0.05 % EX CREA: TOPICAL_CREAM | CUTANEOUS | Status: DC

## 2015-02-10 ENCOUNTER — Encounter: Payer: 59 | Admitting: Women's Health

## 2015-03-18 ENCOUNTER — Ambulatory Visit (INDEPENDENT_AMBULATORY_CARE_PROVIDER_SITE_OTHER): Payer: 59 | Admitting: Women's Health

## 2015-03-18 ENCOUNTER — Encounter: Payer: Self-pay | Admitting: Women's Health

## 2015-03-18 VITALS — BP 118/78 | Ht 61.0 in | Wt 145.0 lb

## 2015-03-18 DIAGNOSIS — Z01419 Encounter for gynecological examination (general) (routine) without abnormal findings: Secondary | ICD-10-CM | POA: Diagnosis not present

## 2015-03-18 DIAGNOSIS — G4700 Insomnia, unspecified: Secondary | ICD-10-CM | POA: Diagnosis not present

## 2015-03-18 DIAGNOSIS — Z13820 Encounter for screening for osteoporosis: Secondary | ICD-10-CM | POA: Diagnosis not present

## 2015-03-18 DIAGNOSIS — Z13220 Encounter for screening for lipoid disorders: Secondary | ICD-10-CM | POA: Diagnosis not present

## 2015-03-18 DIAGNOSIS — L900 Lichen sclerosus et atrophicus: Secondary | ICD-10-CM

## 2015-03-18 LAB — CBC WITH DIFFERENTIAL/PLATELET
Basophils Absolute: 0 10*3/uL (ref 0.0–0.1)
Basophils Relative: 0 % (ref 0–1)
Eosinophils Absolute: 0.2 10*3/uL (ref 0.0–0.7)
Eosinophils Relative: 2 % (ref 0–5)
HEMATOCRIT: 41.1 % (ref 36.0–46.0)
HEMOGLOBIN: 13.8 g/dL (ref 12.0–15.0)
LYMPHS ABS: 2.4 10*3/uL (ref 0.7–4.0)
LYMPHS PCT: 32 % (ref 12–46)
MCH: 30.3 pg (ref 26.0–34.0)
MCHC: 33.6 g/dL (ref 30.0–36.0)
MCV: 90.1 fL (ref 78.0–100.0)
MONOS PCT: 8 % (ref 3–12)
MPV: 9.1 fL (ref 8.6–12.4)
Monocytes Absolute: 0.6 10*3/uL (ref 0.1–1.0)
NEUTROS ABS: 4.4 10*3/uL (ref 1.7–7.7)
NEUTROS PCT: 58 % (ref 43–77)
Platelets: 244 10*3/uL (ref 150–400)
RBC: 4.56 MIL/uL (ref 3.87–5.11)
RDW: 13.5 % (ref 11.5–15.5)
WBC: 7.5 10*3/uL (ref 4.0–10.5)

## 2015-03-18 LAB — COMPREHENSIVE METABOLIC PANEL
ALT: 16 U/L (ref 0–35)
AST: 16 U/L (ref 0–37)
Albumin: 3.5 g/dL (ref 3.5–5.2)
Alkaline Phosphatase: 33 U/L — ABNORMAL LOW (ref 39–117)
BUN: 29 mg/dL — ABNORMAL HIGH (ref 6–23)
CALCIUM: 8.5 mg/dL (ref 8.4–10.5)
CHLORIDE: 104 meq/L (ref 96–112)
CO2: 24 mEq/L (ref 19–32)
CREATININE: 0.87 mg/dL (ref 0.50–1.10)
Glucose, Bld: 87 mg/dL (ref 70–99)
Potassium: 3.9 mEq/L (ref 3.5–5.3)
SODIUM: 140 meq/L (ref 135–145)
TOTAL PROTEIN: 5.4 g/dL — AB (ref 6.0–8.3)
Total Bilirubin: 0.4 mg/dL (ref 0.2–1.2)

## 2015-03-18 LAB — LIPID PANEL
Cholesterol: 178 mg/dL (ref 0–200)
HDL: 46 mg/dL (ref >=46)
LDL Cholesterol: 100 mg/dL — ABNORMAL HIGH (ref 0–99)
TRIGLYCERIDES: 158 mg/dL — AB (ref <150)
Total CHOL/HDL Ratio: 3.9 Ratio
VLDL: 32 mg/dL (ref 0–40)

## 2015-03-18 MED ORDER — CLOBETASOL PROPIONATE 0.05 % EX CREA: TOPICAL_CREAM | CUTANEOUS | Status: DC

## 2015-03-18 MED ORDER — ALPRAZOLAM 0.25 MG PO TABS: 0.2500 mg | ORAL_TABLET | Freq: Every evening | ORAL | Status: DC | PRN

## 2015-03-18 NOTE — Progress Notes (Signed)
Dana Sanders 07-10-1961 [id number]   History:    Presents for annual exam. TVH for DUB. Menopausal, complains of wright gain and insomnia. Tried Melatonin, Paxil, sleep hygiene, meditation. Sees counselor for family issues and has discussed insomnia.  Recent biopsies for lichen sclerosis, has not used Temavate. Normal Pap and mammogram history. . Regular exercise, good diet. Weight gain 20 lbs. Same partner/no recent intercourse.   Past medical history, past surgical history, family history and social history were all reviewed and documented in the EPIC chart. Primary caregiver to father with dementia, has lived2 with her 14 years. Mother won't speak to her, turns family against her. Sees counselor for interpersonal family stress. 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   ROS:  A ROS was performed and pertinent positives and negatives are included.  Exam:  Filed Vitals:   03/18/15 1532  BP: 118/78    General appearance:  Tearful most of exam Thyroid:  Symmetrical, normal in size, without palpable masses or nodularity. Respiratory  Auscultation:  Clear without wheezing or rhonchi Cardiovascular  Auscultation:  Regular rate, without rubs, murmurs or gallops  Edema/varicosities:  Not grossly evident Abdominal  Soft,nontender, without masses, guarding or rebound.  Liver/spleen:  No organomegaly noted  Hernia:  None appreciated  Skin  Inspection:  Grossly normal   Breasts: Examined lying and sitting.     Right: Without masses, retractions, discharge or axillary adenopathy.     Left: Without masses, retractions, discharge or axillary adenopathy. Gentitourinary   Inguinal/mons:  Normal without inguinal adenopathy  External genitalia:  Normal  BUS/Urethra/Skene's glands:  Normal  Vagina:  Normal, dry  Cervix:  Absent  Uterus:  Absent  Adnexa/parametria:     Rt: Without masses or tenderness.   Lt: Without masses or tenderness.  Anus and perineum: Normal  Digital rectal  exam: Normal sphincter tone without palpated masses or tenderness  Assessment/Plan:  54 y.o. DWF G2P1 (Daughter died of leukemia) for annual exam with complains of insomnia and weight gain.  Insomnia Menopause/TVH/no HRT/vaginal atrophy Lichen Sclerosis Hypercholesteremia-primary care manages meds  Plan: Xanax 0.25MG  1/2 tablet PO HS PRN. Reviewed addictive properties to use sparingly.  Continue with counselor/healthy diet and exercise/take personal time for self encouraged. SBE's, continue annual screening mammogram 3-D tomography history of dense breasts. Schedule DEXA. Temovate 0.05% ointment twice daily small amount as needed. Declines Vagifem or estrogen products for vaginal dryness will continue with lubricants. Vit D, CMP, TSH, CBC, Lipid panel. Instructed to take results to primary care for continued medication.  [place], 4:36 PM 03/18/2015

## 2015-03-18 NOTE — Patient Instructions (Signed)
Insomnia Insomnia is frequent trouble falling and/or staying asleep. Insomnia can be a long term problem or a short term problem. Both are common. Insomnia can be a short term problem when the wakefulness is related to a certain stress or worry. Long term insomnia is often related to ongoing stress during waking hours and/or poor sleeping habits. Overtime, sleep deprivation itself can make the problem worse. Every little thing feels more severe because you are overtired and your ability to cope is decreased. CAUSES   Stress, anxiety, and depression.  Poor sleeping habits.  Distractions such as TV in the bedroom.  Naps close to bedtime.  Engaging in emotionally charged conversations before bed.  Technical reading before sleep.  Alcohol and other sedatives. They may make the problem worse. They can hurt normal sleep patterns and normal dream activity.  Stimulants such as caffeine for several hours prior to bedtime.  Pain syndromes and shortness of breath can cause insomnia.  Exercise late at night.  Changing time zones may cause sleeping problems (jet lag). It is sometimes helpful to have someone observe your sleeping patterns. They should look for periods of not breathing during the night (sleep apnea). They should also look to see how long those periods last. If you live alone or observers are uncertain, you can also be observed at a sleep clinic where your sleep patterns will be professionally monitored. Sleep apnea requires a checkup and treatment. Give your caregivers your medical history. Give your caregivers observations your family has made about your sleep.  SYMPTOMS   Not feeling rested in the morning.  Anxiety and restlessness at bedtime.  Difficulty falling and staying asleep. TREATMENT   Your caregiver may prescribe treatment for an underlying medical disorders. Your caregiver can give advice or help if you are using alcohol or other drugs for self-medication. Treatment  of underlying problems will usually eliminate insomnia problems.  Medications can be prescribed for short time use. They are generally not recommended for lengthy use.  Over-the-counter sleep medicines are not recommended for lengthy use. They can be habit forming.  You can promote easier sleeping by making lifestyle changes such as:  Using relaxation techniques that help with breathing and reduce muscle tension.  Exercising earlier in the day.  Changing your diet and the time of your last meal. No night time snacks.  Establish a regular time to go to bed.  Counseling can help with stressful problems and worry.  Soothing music and white noise may be helpful if there are background noises you cannot remove.  Stop tedious detailed work at least one hour before bedtime. HOME CARE INSTRUCTIONS   Keep a diary. Inform your caregiver about your progress. This includes any medication side effects. See your caregiver regularly. Take note of:  Times when you are asleep.  Times when you are awake during the night.  The quality of your sleep.  How you feel the next day. This information will help your caregiver care for you.  Get out of bed if you are still awake after 15 minutes. Read or do some quiet activity. Keep the lights down. Wait until you feel sleepy and go back to bed.  Keep regular sleeping and waking hours. Avoid naps.  Exercise regularly.  Avoid distractions at bedtime. Distractions include watching television or engaging in any intense or detailed activity like attempting to balance the household checkbook.  Develop a bedtime ritual. Keep a familiar routine of bathing, brushing your teeth, climbing into bed at the same   time each night, listening to soothing music. Routines increase the success of falling to sleep faster.  Use relaxation techniques. This can be using breathing and muscle tension release routines. It can also include visualizing peaceful scenes. You can  also help control troubling or intruding thoughts by keeping your mind occupied with boring or repetitive thoughts like the old concept of counting sheep. You can make it more creative like imagining planting one beautiful flower after another in your backyard garden.  During your day, work to eliminate stress. When this is not possible use some of the previous suggestions to help reduce the anxiety that accompanies stressful situations. MAKE SURE YOU:   Understand these instructions.  Will watch your condition.  Will get help right away if you are not doing well or get worse. Document Released: 09/03/2000 Document Revised: 11/29/2011 Document Reviewed: 10/04/2007 [facility] Patient Information 2015 [place], [place]. This information is not intended to replace advice given to you by your health care provider. Make sure you discuss any questions you have with your health care provider. Health Recommendations for Postmenopausal Women Respected and ongoing research has looked at the most common causes of death, disability, and poor quality of life in postmenopausal women. The causes include heart disease, diseases of blood vessels, diabetes, depression, cancer, and bone loss (osteoporosis). Many things can be done to help lower the chances of developing these and other common problems. CARDIOVASCULAR DISEASE Heart Disease: A heart attack is a medical emergency. Know the signs and symptoms of a heart attack. Below are things women can do to reduce their risk for heart disease.   Do not smoke. If you smoke, quit.  Aim for a healthy weight. Being overweight causes many preventable deaths. Eat a healthy and balanced diet and drink an adequate amount of liquids.  Get moving. Make a commitment to be more physically active. Aim for 30 minutes of activity on most, if not all days of the week.  Eat for heart health. Choose a diet that is low in saturated fat and cholesterol and eliminate trans fat. Include  whole grains, vegetables, and fruits. Read and understand the labels on food containers before buying.  Know your numbers. Ask your caregiver to check your blood pressure, cholesterol (total, HDL, LDL, triglycerides) and blood glucose. Work with your caregiver on improving your entire clinical picture.  High blood pressure. Limit or stop your table salt intake (try salt substitute and food seasonings). Avoid salty foods and drinks. Read labels on food containers before buying. Eating well and exercising can help control high blood pressure. STROKE  Stroke is a medical emergency. Stroke may be the result of a blood clot in a blood vessel in the brain or by a brain hemorrhage (bleeding). Know the signs and symptoms of a stroke. To lower the risk of developing a stroke:  Avoid fatty foods.  Quit smoking.  Control your diabetes, blood pressure, and irregular heart rate. THROMBOPHLEBITIS (BLOOD CLOT) OF THE LEG  Becoming overweight and leading a stationary lifestyle may also contribute to developing blood clots. Controlling your diet and exercising will help lower the risk of developing blood clots. CANCER SCREENING  Breast Cancer: Take steps to reduce your risk of breast cancer.  You should practice "breast self-awareness." This means understanding the normal appearance and feel of your breasts and should include breast self-examination. Any changes detected, no matter how small, should be reported to your caregiver.  After age 60, you should have a clinical breast exam (CBE) every  year.  Starting at age 49, you should consider having a mammogram (breast X-ray) every year.  If you have a family history of breast cancer, talk to your caregiver about genetic screening.  If you are at high risk for breast cancer, talk to your caregiver about having an MRI and a mammogram every year.  Intestinal or Stomach Cancer: Tests to consider are a rectal exam, fecal occult blood, sigmoidoscopy, and  colonoscopy. Women who are high risk may need to be screened at an earlier age and more often.  Cervical Cancer:  Beginning at age 69, you should have a Pap test every 3 years as long as the past 3 Pap tests have been normal.  If you have had past treatment for cervical cancer or a condition that could lead to cancer, you need Pap tests and screening for cancer for at least 20 years after your treatment.  If you had a hysterectomy for a problem that was not cancer or a condition that could lead to cancer, then you no longer need Pap tests.  If you are between ages 29 and 61, and you have had normal Pap tests going back 10 years, you no longer need Pap tests.  If Pap tests have been discontinued, risk factors (such as a new sexual partner) need to be reassessed to determine if screening should be resumed.  Some medical problems can increase the chance of getting cervical cancer. In these cases, your caregiver may recommend more frequent screening and Pap tests.  Uterine Cancer: If you have vaginal bleeding after reaching menopause, you should notify your caregiver.  Ovarian Cancer: Other than yearly pelvic exams, there are no reliable tests available to screen for ovarian cancer at this time except for yearly pelvic exams.  Lung Cancer: Yearly chest X-rays can detect lung cancer and should be done on high risk women, such as cigarette smokers and women with chronic lung disease (emphysema).  Skin Cancer: A complete body skin exam should be done at your yearly examination. Avoid overexposure to the sun and ultraviolet light lamps. Use a strong sun block cream when in the sun. All of these things are important for lowering the risk of skin cancer. MENOPAUSE Menopause Symptoms: Hormone therapy products are effective for treating symptoms associated with menopause:  Moderate to severe hot flashes.  Night sweats.  Mood swings.  Headaches.  Tiredness.  Loss of sex  drive.  Insomnia.  Other symptoms. Hormone replacement carries certain risks, especially in older women. Women who use or are thinking about using estrogen or estrogen with progestin treatments should discuss that with their caregiver. Your caregiver will help you understand the benefits and risks. The ideal dose of hormone replacement therapy is not known. The Food and Drug Administration (FDA) has concluded that hormone therapy should be used only at the lowest doses and for the shortest amount of time to reach treatment goals.  OSTEOPOROSIS Protecting Against Bone Loss and Preventing Fracture If you use hormone therapy for prevention of bone loss (osteoporosis), the risks for bone loss must outweigh the risk of the therapy. Ask your caregiver about other medications known to be safe and effective for preventing bone loss and fractures. To guard against bone loss or fractures, the following is recommended:  If you are younger than age 7, take 1000 mg of calcium and at least 600 mg of Vitamin D per day.  If you are older than age 71 but younger than age 18, take 1200 mg of calcium  and at least 600 mg of Vitamin D per day.  If you are older than age 66, take 1200 mg of calcium and at least 800 mg of Vitamin D per day. Smoking and excessive alcohol intake increases the risk of osteoporosis. Eat foods rich in calcium and vitamin D and do weight bearing exercises several times a week as your caregiver suggests. DIABETES Diabetes Mellitus: If you have type I or type 2 diabetes, you should keep your blood sugar under control with diet, exercise, and recommended medication. Avoid starchy and fatty foods, and too many sweets. Being overweight can make diabetes control more difficult. COGNITION AND MEMORY Cognition and Memory: Menopausal hormone therapy is not recommended for the prevention of cognitive disorders such as Alzheimer's disease or memory loss.  DEPRESSION  Depression may occur at any age,  but it is common in elderly women. This may be because of physical, medical, social (loneliness), or financial problems and needs. If you are experiencing depression because of medical problems and control of symptoms, talk to your caregiver about this. Physical activity and exercise may help with mood and sleep. Community and volunteer involvement may improve your sense of value and worth. If you have depression and you feel that the problem is getting worse or becoming severe, talk to your caregiver about which treatment options are best for you. ACCIDENTS  Accidents are common and can be serious in elderly woman. Prepare your house to prevent accidents. Eliminate throw rugs, place hand bars in bath, shower, and toilet areas. Avoid wearing high heeled shoes or walking on wet, snowy, and icy areas. Limit or stop driving if you have vision or hearing problems, or if you feel you are unsteady with your movements and reflexes. HEPATITIS C Hepatitis C is a type of viral infection affecting the liver. It is spread mainly through contact with blood from an infected person. It can be treated, but if left untreated, it can lead to severe liver damage over the years. Many people who are infected do not know that the virus is in their blood. If you are a "baby-boomer", it is recommended that you have one screening test for Hepatitis C. IMMUNIZATIONS  Several immunizations are important to consider having during your senior years, including:   Tetanus, diphtheria, and pertussis booster shot.  Influenza every year before the flu season begins.  Pneumonia vaccine.  Shingles vaccine.  Others, as indicated based on your specific needs. Talk to your caregiver about these. Document Released: 10/29/2005 Document Revised: 01/21/2014 Document Reviewed: 06/24/2008 [facility] Patient Information 2015 [place], [place]. This information is not intended to replace advice given to you by your health care provider. Make sure  you discuss any questions you have with your health care provider.

## 2015-03-19 LAB — VITAMIN D 25 HYDROXY (VIT D DEFICIENCY, FRACTURES): Vit D, 25-Hydroxy: 20 ng/mL — ABNORMAL LOW (ref 30–100)

## 2015-03-19 LAB — TSH: TSH: 1.561 u[IU]/mL (ref 0.350–4.500)

## 2015-03-20 ENCOUNTER — Other Ambulatory Visit: Payer: Self-pay | Admitting: Women's Health

## 2015-03-20 DIAGNOSIS — E559 Vitamin D deficiency, unspecified: Secondary | ICD-10-CM

## 2015-03-20 MED ORDER — VITAMIN D (ERGOCALCIFEROL) 1.25 MG (50000 UNIT) PO CAPS: 50000.0000 [IU] | ORAL_CAPSULE | ORAL | Status: DC

## 2015-04-08 ENCOUNTER — Telehealth: Payer: Self-pay

## 2015-04-08 NOTE — Telephone Encounter (Signed)
Patient called stating she had a message from me to call and it was from quite awhile back but she just heard it.  I called her back and reminded her that we spoke back on 03/20/15 about her lab results. I had initially left a voice mail message but she called me back same day.  She has picked up the Vit D we discussed that day and has been taking it. I reviewed Nancy's result note with her again and reminded her to return at the end of the Vit D 50000 to have Vit D level rechecked and to start Vit d3 2000units after she finishes Vit D rx.

## 2015-04-09 ENCOUNTER — Other Ambulatory Visit: Payer: Self-pay | Admitting: Orthopedic Surgery

## 2015-04-09 DIAGNOSIS — R05 Cough: Secondary | ICD-10-CM

## 2015-04-09 DIAGNOSIS — R053 Chronic cough: Secondary | ICD-10-CM

## 2015-04-09 MED ORDER — TRAMADOL HCL 50 MG PO TABS: 50.0000 mg | ORAL_TABLET | Freq: Two times a day (BID) | ORAL | Status: DC | PRN

## 2015-04-23 ENCOUNTER — Other Ambulatory Visit: Payer: Self-pay | Admitting: Geriatric Medicine

## 2015-04-23 DIAGNOSIS — E041 Nontoxic single thyroid nodule: Secondary | ICD-10-CM

## 2015-04-28 ENCOUNTER — Ambulatory Visit
Admission: RE | Admit: 2015-04-28 | Discharge: 2015-04-28 | Disposition: A | Discharge status: Home / Self Care | Payer: 59 | Source: Ambulatory Visit | Attending: Geriatric Medicine | Admitting: Geriatric Medicine

## 2015-04-28 ENCOUNTER — Other Ambulatory Visit: Payer: Self-pay | Admitting: Geriatric Medicine

## 2015-04-28 DIAGNOSIS — E041 Nontoxic single thyroid nodule: Secondary | ICD-10-CM

## 2015-04-28 DIAGNOSIS — R05 Cough: Secondary | ICD-10-CM

## 2015-04-28 DIAGNOSIS — R059 Cough, unspecified: Secondary | ICD-10-CM

## 2015-05-22 ENCOUNTER — Ambulatory Visit (INDEPENDENT_AMBULATORY_CARE_PROVIDER_SITE_OTHER): Payer: 59

## 2015-05-22 DIAGNOSIS — Z13820 Encounter for screening for osteoporosis: Secondary | ICD-10-CM | POA: Diagnosis not present

## 2015-09-29 MED FILL — traMADol HCL 50 MG TABS: 50 | 14 days supply | Qty: 42 | Fill #0

## 2015-10-10 DIAGNOSIS — R05 Cough: Secondary | ICD-10-CM | POA: Diagnosis not present

## 2015-10-20 MED FILL — traMADol HCL 50 MG TABS: 50 | 30 days supply | Qty: 240 | Fill #0

## 2015-11-10 MED FILL — DOXYCYCLINE HYCLATE 100 MG: 100 | 5 days supply | Qty: 10 | Fill #0

## 2015-11-17 ENCOUNTER — Institutional Professional Consult (permissible substitution): Payer: 59 | Admitting: Pulmonary Disease

## 2015-11-17 MED FILL — DOXYCYCLINE HYCLATE 100 MG: 100 | 7 days supply | Qty: 14 | Fill #0

## 2015-11-26 ENCOUNTER — Institutional Professional Consult (permissible substitution): Payer: 59 | Admitting: Pulmonary Disease

## 2015-11-28 MED FILL — SIMVASTATIN 10 MG TABLET: 10 | 90 days supply | Qty: 90 | Fill #1

## 2016-01-07 ENCOUNTER — Other Ambulatory Visit: Payer: Self-pay

## 2016-01-07 DIAGNOSIS — Z1231 Encounter for screening mammogram for malignant neoplasm of breast: Secondary | ICD-10-CM

## 2016-01-27 ENCOUNTER — Ambulatory Visit
Admission: RE | Admit: 2016-01-27 | Discharge: 2016-01-27 | Disposition: A | Discharge status: Home / Self Care | Payer: 59 | Source: Ambulatory Visit

## 2016-01-27 DIAGNOSIS — Z1231 Encounter for screening mammogram for malignant neoplasm of breast: Secondary | ICD-10-CM

## 2016-03-02 DIAGNOSIS — M545 Low back pain: Secondary | ICD-10-CM | POA: Diagnosis not present

## 2016-03-10 MED FILL — SIMVASTATIN 10 MG TABLET: 10 | 90 days supply | Qty: 90 | Fill #0

## 2016-04-19 DIAGNOSIS — F321 Major depressive disorder, single episode, moderate: Secondary | ICD-10-CM | POA: Diagnosis not present

## 2016-04-26 ENCOUNTER — Other Ambulatory Visit: Payer: Self-pay | Admitting: Geriatric Medicine

## 2016-04-26 DIAGNOSIS — Z79899 Other long term (current) drug therapy: Secondary | ICD-10-CM | POA: Diagnosis not present

## 2016-04-26 DIAGNOSIS — E041 Nontoxic single thyroid nodule: Secondary | ICD-10-CM

## 2016-04-26 DIAGNOSIS — F5101 Primary insomnia: Secondary | ICD-10-CM | POA: Diagnosis not present

## 2016-04-26 DIAGNOSIS — E7800 Pure hypercholesterolemia, unspecified: Secondary | ICD-10-CM | POA: Diagnosis not present

## 2016-04-26 DIAGNOSIS — Z0000 Encounter for general adult medical examination without abnormal findings: Secondary | ICD-10-CM | POA: Diagnosis not present

## 2016-05-05 ENCOUNTER — Other Ambulatory Visit: Payer: 59

## 2016-05-12 ENCOUNTER — Other Ambulatory Visit: Payer: 59

## 2016-05-14 ENCOUNTER — Other Ambulatory Visit: Payer: 59

## 2016-05-18 ENCOUNTER — Ambulatory Visit
Admission: RE | Admit: 2016-05-18 | Discharge: 2016-05-18 | Disposition: A | Discharge status: Home / Self Care | Payer: 59 | Source: Ambulatory Visit | Attending: Geriatric Medicine | Admitting: Geriatric Medicine

## 2016-05-18 DIAGNOSIS — E041 Nontoxic single thyroid nodule: Secondary | ICD-10-CM

## 2016-05-18 DIAGNOSIS — E042 Nontoxic multinodular goiter: Secondary | ICD-10-CM | POA: Diagnosis not present

## 2016-07-07 MED FILL — SIMVASTATIN 10 MG TABLET: 10 | 90 days supply | Qty: 90 | Fill #1

## 2016-07-12 DIAGNOSIS — Z0100 Encounter for examination of eyes and vision without abnormal findings: Secondary | ICD-10-CM | POA: Diagnosis not present

## 2016-10-20 MED FILL — traMADol HCL 50 MG TABS: 50 | 10 days supply | Qty: 40 | Fill #0

## 2016-10-27 MED FILL — SIMVASTATIN 10 MG TABLET: 10 | 90 days supply | Qty: 90 | Fill #0

## 2016-10-29 MED FILL — traMADol HCL 50 MG TABS: 50 | 10 days supply | Qty: 40 | Fill #1

## 2016-11-08 MED FILL — traMADol HCL 50 MG TABS: 50 | 10 days supply | Qty: 40 | Fill #0

## 2016-11-16 MED FILL — traMADol HCL 50 MG TABS: 50 | 10 days supply | Qty: 40 | Fill #1

## 2016-11-29 MED FILL — traMADol HCL 50 MG TABS: 50 | 10 days supply | Qty: 40 | Fill #0

## 2016-12-09 MED FILL — traMADol HCL 50 MG TABS: 50 | 10 days supply | Qty: 40 | Fill #1

## 2016-12-22 MED FILL — traMADol HCL 50 MG TABS: 50 | 10 days supply | Qty: 40 | Fill #2

## 2017-01-19 ENCOUNTER — Other Ambulatory Visit: Payer: Self-pay | Admitting: Geriatric Medicine

## 2017-01-19 DIAGNOSIS — Z1231 Encounter for screening mammogram for malignant neoplasm of breast: Secondary | ICD-10-CM

## 2017-01-25 DIAGNOSIS — F321 Major depressive disorder, single episode, moderate: Secondary | ICD-10-CM | POA: Diagnosis not present

## 2017-01-27 MED FILL — PARoxetine HCL 10 MG TABS: 10 | 30 days supply | Qty: 15 | Fill #0

## 2017-02-02 ENCOUNTER — Encounter: Payer: Self-pay | Admitting: Gynecology

## 2017-02-09 DIAGNOSIS — F321 Major depressive disorder, single episode, moderate: Secondary | ICD-10-CM | POA: Diagnosis not present

## 2017-02-09 MED FILL — SIMVASTATIN 10 MG TABLET: 10 | 90 days supply | Qty: 90 | Fill #1

## 2017-02-21 ENCOUNTER — Ambulatory Visit
Admission: RE | Admit: 2017-02-21 | Discharge: 2017-02-21 | Disposition: A | Discharge status: Home / Self Care | Payer: 59 | Source: Ambulatory Visit | Attending: Geriatric Medicine | Admitting: Geriatric Medicine

## 2017-02-21 DIAGNOSIS — Z1231 Encounter for screening mammogram for malignant neoplasm of breast: Secondary | ICD-10-CM

## 2017-02-23 DIAGNOSIS — F321 Major depressive disorder, single episode, moderate: Secondary | ICD-10-CM | POA: Diagnosis not present

## 2017-02-23 MED FILL — PARoxetine HCL 10 MG TABS: 10 | 30 days supply | Qty: 15 | Fill #1

## 2017-03-16 DIAGNOSIS — F321 Major depressive disorder, single episode, moderate: Secondary | ICD-10-CM | POA: Diagnosis not present

## 2017-03-29 DIAGNOSIS — F321 Major depressive disorder, single episode, moderate: Secondary | ICD-10-CM | POA: Diagnosis not present

## 2017-03-30 ENCOUNTER — Other Ambulatory Visit (INDEPENDENT_AMBULATORY_CARE_PROVIDER_SITE_OTHER): Payer: Self-pay | Admitting: Orthopaedic Surgery

## 2017-03-30 MED ORDER — SULFAMETHOXAZOLE-TRIMETHOPRIM 800-160 MG PO TABS: 1.0000 | ORAL_TABLET | Freq: Two times a day (BID) | ORAL | 0 refills | Status: DC

## 2017-03-30 MED FILL — SULFAMETHOXAZOLE/TMP DS TAB: 800-160 | 10 days supply | Qty: 20 | Fill #0

## 2017-05-12 DIAGNOSIS — F321 Major depressive disorder, single episode, moderate: Secondary | ICD-10-CM | POA: Diagnosis not present

## 2017-05-24 MED FILL — SIMVASTATIN 10 MG TABLET: 10 | 90 days supply | Qty: 90 | Fill #0

## 2017-05-26 DIAGNOSIS — F321 Major depressive disorder, single episode, moderate: Secondary | ICD-10-CM | POA: Diagnosis not present

## 2017-06-09 MED FILL — traMADol HCL 50 MG TABS: 50 | 5 days supply | Qty: 20 | Fill #0

## 2017-06-15 DIAGNOSIS — F321 Major depressive disorder, single episode, moderate: Secondary | ICD-10-CM | POA: Diagnosis not present

## 2017-07-12 DIAGNOSIS — H1013 Acute atopic conjunctivitis, bilateral: Secondary | ICD-10-CM | POA: Diagnosis not present

## 2017-07-12 MED FILL — PREDNISOLONE AC 1% EYE DROP: 1 | 12 days supply | Qty: 5 | Fill #0

## 2017-07-29 DIAGNOSIS — E7800 Pure hypercholesterolemia, unspecified: Secondary | ICD-10-CM | POA: Diagnosis not present

## 2017-07-29 DIAGNOSIS — Z79899 Other long term (current) drug therapy: Secondary | ICD-10-CM | POA: Diagnosis not present

## 2017-07-29 DIAGNOSIS — E041 Nontoxic single thyroid nodule: Secondary | ICD-10-CM | POA: Diagnosis not present

## 2017-07-29 DIAGNOSIS — Z0000 Encounter for general adult medical examination without abnormal findings: Secondary | ICD-10-CM | POA: Diagnosis not present

## 2017-08-02 DIAGNOSIS — F321 Major depressive disorder, single episode, moderate: Secondary | ICD-10-CM | POA: Diagnosis not present

## 2017-09-02 MED FILL — SIMVASTATIN 10 MG TABLET: 10 | 90 days supply | Qty: 90 | Fill #1

## 2017-09-26 MED FILL — traMADol HCL 50 MG TABS: 50 | 10 days supply | Qty: 40 | Fill #0

## 2017-10-11 DIAGNOSIS — F321 Major depressive disorder, single episode, moderate: Secondary | ICD-10-CM | POA: Diagnosis not present

## 2017-10-21 MED FILL — traMADol HCL 50 MG TABS: 50 | 10 days supply | Qty: 40 | Fill #0

## 2017-11-02 DIAGNOSIS — F321 Major depressive disorder, single episode, moderate: Secondary | ICD-10-CM | POA: Diagnosis not present

## 2017-11-15 MED FILL — traMADol HCL 50 MG TABS: 50 | 10 days supply | Qty: 40 | Fill #1

## 2017-12-12 DIAGNOSIS — H2513 Age-related nuclear cataract, bilateral: Secondary | ICD-10-CM | POA: Diagnosis not present

## 2017-12-12 DIAGNOSIS — H5203 Hypermetropia, bilateral: Secondary | ICD-10-CM | POA: Diagnosis not present

## 2017-12-12 DIAGNOSIS — H52203 Unspecified astigmatism, bilateral: Secondary | ICD-10-CM | POA: Diagnosis not present

## 2017-12-12 DIAGNOSIS — H524 Presbyopia: Secondary | ICD-10-CM | POA: Diagnosis not present

## 2017-12-22 MED FILL — SIMVASTATIN 10 MG TABLET: 10 | 30 days supply | Qty: 30 | Fill #0

## 2018-01-02 DIAGNOSIS — H5203 Hypermetropia, bilateral: Secondary | ICD-10-CM | POA: Diagnosis not present

## 2018-01-02 DIAGNOSIS — H52203 Unspecified astigmatism, bilateral: Secondary | ICD-10-CM | POA: Diagnosis not present

## 2018-01-02 DIAGNOSIS — H524 Presbyopia: Secondary | ICD-10-CM | POA: Diagnosis not present

## 2018-01-13 DIAGNOSIS — F332 Major depressive disorder, recurrent severe without psychotic features: Secondary | ICD-10-CM | POA: Diagnosis not present

## 2018-01-13 DIAGNOSIS — F321 Major depressive disorder, single episode, moderate: Secondary | ICD-10-CM | POA: Diagnosis not present

## 2018-02-07 DIAGNOSIS — F332 Major depressive disorder, recurrent severe without psychotic features: Secondary | ICD-10-CM | POA: Diagnosis not present

## 2018-02-10 MED FILL — SIMVASTATIN 10 MG TABLET: 10 | 90 days supply | Qty: 90 | Fill #1

## 2018-02-16 ENCOUNTER — Other Ambulatory Visit: Payer: Self-pay | Admitting: Geriatric Medicine

## 2018-02-16 DIAGNOSIS — Z1231 Encounter for screening mammogram for malignant neoplasm of breast: Secondary | ICD-10-CM

## 2018-03-17 ENCOUNTER — Institutional Professional Consult (permissible substitution): Payer: 59 | Admitting: Pulmonary Disease

## 2018-03-20 ENCOUNTER — Ambulatory Visit
Admission: RE | Admit: 2018-03-20 | Discharge: 2018-03-20 | Disposition: A | Discharge status: Home / Self Care | Payer: 59 | Source: Ambulatory Visit | Attending: Geriatric Medicine | Admitting: Geriatric Medicine

## 2018-03-20 ENCOUNTER — Ambulatory Visit: Payer: 59

## 2018-03-20 DIAGNOSIS — Z1231 Encounter for screening mammogram for malignant neoplasm of breast: Secondary | ICD-10-CM

## 2018-03-27 ENCOUNTER — Encounter: Payer: Self-pay | Admitting: Pulmonary Disease

## 2018-03-27 ENCOUNTER — Ambulatory Visit (INDEPENDENT_AMBULATORY_CARE_PROVIDER_SITE_OTHER): Payer: 59 | Admitting: Pulmonary Disease

## 2018-03-27 VITALS — BP 100/70 | HR 81 | Ht 61.0 in | Wt 137.0 lb

## 2018-03-27 DIAGNOSIS — F99 Mental disorder, not otherwise specified: Secondary | ICD-10-CM | POA: Diagnosis not present

## 2018-03-27 DIAGNOSIS — F5105 Insomnia due to other mental disorder: Secondary | ICD-10-CM

## 2018-03-27 DIAGNOSIS — F4310 Post-traumatic stress disorder, unspecified: Secondary | ICD-10-CM | POA: Diagnosis not present

## 2018-03-27 NOTE — Progress Notes (Signed)
   Subjective:    Patient ID: Dana Sanders, female    DOB: Mar 22, 1961, 57 y.o.   MRN: [id number]  HPI    Review of Systems  Constitutional: Negative for fever and unexpected weight change.  HENT: Negative for congestion, dental problem, ear pain, nosebleeds, postnasal drip, rhinorrhea, sinus pressure, sneezing, sore throat and trouble swallowing.   Eyes: Negative for redness and itching.  Respiratory: Negative for cough, chest tightness, shortness of breath and wheezing.   Cardiovascular: Negative for palpitations and leg swelling.  Gastrointestinal: Negative for nausea and vomiting.  Genitourinary: Negative for dysuria.  Musculoskeletal: Negative for joint swelling.  Skin: Negative for rash.  Allergic/Immunologic: Positive for food allergies. Negative for environmental allergies and immunocompromised state.  Neurological: Negative for headaches.  Hematological: Does not bruise/bleed easily.  Psychiatric/Behavioral: Negative for dysphoric mood. The patient is nervous/anxious.        Objective:   Physical Exam        Assessment & Plan:

## 2018-03-27 NOTE — Patient Instructions (Signed)
Call for follow up if your sleep difficulties change or get worse

## 2018-03-27 NOTE — Progress Notes (Signed)
[facility]  Chief Complaint  Patient presents with  . sleep consult    Pt is up every other hour during the night. Pt going through alot of stress lately, doesn't want sleep aids to sleep.    Constitutional: BP 100/70 (BP Location: Left Arm, Cuff Size: Normal)   Pulse 81   Ht 5\' 1"  (1.549 m)   Wt 137 lb (62.1 kg)   SpO2 97%   BMI 25.89 kg/m   History of Present Illness: Dana Sanders is a 57 y.o. female sleep difficulties.  She was in the [employer] about 30 yrs ago.  Unfortunately [place] military base her 82 week old daughter was diagnosed with leukemia and eventually passed away.  This is when her sleep difficulties started.  She has trouble falling asleep and staying asleep.  She struggles trying to cope with the lose of her daughter.  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  She feels depressed and overwhelmed.  She has tried medications for depression before, but these seemed to have a paradoxical effect.  She was recently approved for a councilor outside the [place], and will be working with the [employer].  She goes to sleep at 9 pm.  She falls asleep can take up to 3 hours to fall asleep.  She will lay awake thinking about random things.  She has trouble shutting her mind down.  She gets out of bed at 430 am so that she can start tending to her father before she has to go to work at 63 am.  She works as a Passenger transport manager at [employer]  She feels okay in the morning.  She denies morning headache.  She does not use anything to help her fall sleep or stay awake.  She denies sleep walking, sleep talking, bruxism, or nightmares.  There is no history of restless legs.  She denies sleep hallucinations, sleep paralysis, or cataplexy.  The Epworth score is 0 out of 24.    Comprehensive Respiratory Exam:  Appearance - well kempt  ENMT - nasal mucosa moist, turbinates clear, midline nasal septum, no dental lesions, no  gingival bleeding, no oral exudates, no tonsillar hypertrophy Neck - no masses, trachea midline, no thyromegaly, no elevation in JVP Respiratory - normal appearance of chest wall, normal respiratory effort w/o accessory muscle use, no wheezing or rales CV - s1s2 regular rate and rhythm, no murmurs, no peripheral edema, no varicosities, radial pulses symmetric GI - soft, non tender, no masses, no hepatosplenomegaly Lymph - no adenopathy noted in neck and axillary areas MSK - normal muscle strength and tone, normal gait Ext - no cyanosis, clubbing, or joint inflammation noted Skin - no rashes, lesions, or ulcers Neuro - oriented to person, place, and time Psych - appears distraught, and was tearful at times during the interview  Discussion: She has trouble falling asleep and staying asleep.  This has been present for years, and started after the lose of her daughter while she was stationed on [employer] base.  This has triggered PTSD with resulting insomnia.  I do not have clinical suspicion for sleep apnea, periodic limb movements, restless leg syndrome, or parasomnias.  I do not think a sleep study is warranted at this time.  Assessment/Plan:  Insomnia with history of PTSD. - discussed sleep restriction, stimulus control, and relaxation techniques - discussed the option of sleep aide medication, but wouldn't do this without first coordinating with  her councilor - she is to f/u with her councilor for management of her PTSD  Advised she could return to pulmonary/sleep medicine clinic if her sleep symptoms progress or change   Patient Instructions  Call for follow up if your sleep difficulties change or get worse    Chesley Mires, MD [place] 03/27/2018, 5:03 PM  Flow Sheet  Review of Systems: Constitutional: Negative for fever and unexpected weight change.  HENT: Negative for congestion, dental problem, ear pain, nosebleeds, postnasal drip, rhinorrhea, sinus  pressure, sneezing, sore throat and trouble swallowing.   Eyes: Negative for redness and itching.  Respiratory: Negative for cough, chest tightness, shortness of breath and wheezing.   Cardiovascular: Negative for palpitations and leg swelling.  Gastrointestinal: Negative for nausea and vomiting.  Genitourinary: Negative for dysuria.  Musculoskeletal: Negative for joint swelling.  Skin: Negative for rash.  Allergic/Immunologic: Positive for food allergies. Negative for environmental allergies and immunocompromised state.  Neurological: Negative for headaches.  Hematological: Does not bruise/bleed easily.  Psychiatric/Behavioral: Negative for dysphoric mood. The patient is nervous/anxious.    Past Medical History: She  has a past medical history of Depression and Lichen sclerosus.  Past Surgical History: She  has a past surgical history that includes Shoulder surgery; septorhinoplasty (1988); Breast surgery; Total abdominal hysterectomy (1999); Finger arthodesis (Right, 02/06/2013); Cystoscopy with stent placement (Left, 07/24/2014); Cystoscopy w/ retrogrades (Left, 07/24/2014); Breast cyst aspiration (12/12/2012); and Augmentation mammaplasty (Bilateral, 2012).  Family History: Her family history includes Allergies in her son; Heart disease in her father; Leukemia in her daughter; Rheum arthritis in her sister.  Social History: She  reports that she quit smoking about 12 years ago. Her smoking use included cigarettes. She has a 3.00 pack-year smoking history. She has never used smokeless tobacco. She reports that she drinks alcohol. She reports that she does not use drugs.  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 Always use your most recent med list.          simvastatin 10 MG tablet Commonly known  as:  ZOCOR Take 10 mg by mouth daily.

## 2018-03-30 DIAGNOSIS — F321 Major depressive disorder, single episode, moderate: Secondary | ICD-10-CM | POA: Diagnosis not present

## 2018-04-11 ENCOUNTER — Emergency Department (HOSPITAL_COMMUNITY): Payer: 59

## 2018-04-11 ENCOUNTER — Other Ambulatory Visit: Payer: Self-pay

## 2018-04-11 ENCOUNTER — Encounter (HOSPITAL_COMMUNITY): Payer: Self-pay | Admitting: *Deleted

## 2018-04-11 ENCOUNTER — Encounter (HOSPITAL_COMMUNITY): Payer: Self-pay

## 2018-04-11 ENCOUNTER — Other Ambulatory Visit (HOSPITAL_COMMUNITY): Payer: Self-pay | Admitting: Orthopedic Surgery

## 2018-04-11 ENCOUNTER — Emergency Department (HOSPITAL_COMMUNITY)
Admission: EM | Admit: 2018-04-11 | Discharge: 2018-04-11 | Disposition: A | Discharge status: Home / Self Care | Payer: 59 | Attending: Emergency Medicine | Admitting: Emergency Medicine

## 2018-04-11 DIAGNOSIS — Y9301 Activity, walking, marching and hiking: Secondary | ICD-10-CM | POA: Insufficient documentation

## 2018-04-11 DIAGNOSIS — S6991XA Unspecified injury of right wrist, hand and finger(s), initial encounter: Secondary | ICD-10-CM | POA: Diagnosis present

## 2018-04-11 DIAGNOSIS — S52591A Other fractures of lower end of right radius, initial encounter for closed fracture: Secondary | ICD-10-CM | POA: Insufficient documentation

## 2018-04-11 DIAGNOSIS — S52501A Unspecified fracture of the lower end of right radius, initial encounter for closed fracture: Secondary | ICD-10-CM | POA: Diagnosis not present

## 2018-04-11 DIAGNOSIS — S62101A Fracture of unspecified carpal bone, right wrist, initial encounter for closed fracture: Secondary | ICD-10-CM

## 2018-04-11 DIAGNOSIS — Y92481 Parking lot as the place of occurrence of the external cause: Secondary | ICD-10-CM | POA: Diagnosis not present

## 2018-04-11 DIAGNOSIS — S199XXA Unspecified injury of neck, initial encounter: Secondary | ICD-10-CM | POA: Diagnosis not present

## 2018-04-11 DIAGNOSIS — S99911A Unspecified injury of right ankle, initial encounter: Secondary | ICD-10-CM | POA: Diagnosis not present

## 2018-04-11 DIAGNOSIS — Z87891 Personal history of nicotine dependence: Secondary | ICD-10-CM | POA: Diagnosis not present

## 2018-04-11 DIAGNOSIS — Y999 Unspecified external cause status: Secondary | ICD-10-CM | POA: Diagnosis not present

## 2018-04-11 DIAGNOSIS — M25511 Pain in right shoulder: Secondary | ICD-10-CM | POA: Diagnosis not present

## 2018-04-11 DIAGNOSIS — S4991XA Unspecified injury of right shoulder and upper arm, initial encounter: Secondary | ICD-10-CM | POA: Diagnosis not present

## 2018-04-11 DIAGNOSIS — M7989 Other specified soft tissue disorders: Secondary | ICD-10-CM | POA: Diagnosis not present

## 2018-04-11 LAB — BASIC METABOLIC PANEL
ANION GAP: 11 (ref 5–15)
BUN: 16 mg/dL (ref 6–20)
CO2: 22 mmol/L (ref 22–32)
Calcium: 9.5 mg/dL (ref 8.9–10.3)
Chloride: 106 mmol/L (ref 98–111)
Creatinine, Ser: 1.09 mg/dL — ABNORMAL HIGH (ref 0.44–1.00)
GFR calc Af Amer: >60 mL/min (ref >60)
GFR, EST NON AFRICAN AMERICAN: 56 mL/min — AB (ref >60)
GLUCOSE: 149 mg/dL — AB (ref 70–99)
POTASSIUM: 4.1 mmol/L (ref 3.5–5.1)
Sodium: 139 mmol/L (ref 135–145)

## 2018-04-11 LAB — CBC WITH DIFFERENTIAL/PLATELET
ABS IMMATURE GRANULOCYTES: 0 10*3/uL (ref 0.0–0.1)
BASOS PCT: 1 %
Basophils Absolute: 0.1 10*3/uL (ref 0.0–0.1)
EOS ABS: 0.2 10*3/uL (ref 0.0–0.7)
Eosinophils Relative: 3 %
HEMATOCRIT: 41.7 % (ref 36.0–46.0)
Hemoglobin: 13.9 g/dL (ref 12.0–15.0)
Immature Granulocytes: 0 %
LYMPHS ABS: 2.7 10*3/uL (ref 0.7–4.0)
Lymphocytes Relative: 49 %
MCH: 29.8 pg (ref 26.0–34.0)
MCHC: 33.3 g/dL (ref 30.0–36.0)
MCV: 89.5 fL (ref 78.0–100.0)
MONO ABS: 0.6 10*3/uL (ref 0.1–1.0)
MONOS PCT: 10 %
Neutro Abs: 2 10*3/uL (ref 1.7–7.7)
Neutrophils Relative %: 37 %
PLATELETS: 209 10*3/uL (ref 150–400)
RBC: 4.66 MIL/uL (ref 3.87–5.11)
RDW: 12.5 % (ref 11.5–15.5)
WBC: 5.5 10*3/uL (ref 4.0–10.5)

## 2018-04-11 MED ORDER — FENTANYL CITRATE (PF) 100 MCG/2ML IJ SOLN
100.0000 ug | Freq: Once | INTRAMUSCULAR | Status: AC
  Administered 2018-04-11: 100 ug via INTRAVENOUS
  Filled 2018-04-11: qty 2

## 2018-04-11 MED ORDER — SODIUM CHLORIDE 0.9 % IV BOLUS
1000.0000 mL | Freq: Once | INTRAVENOUS | Status: AC
  Administered 2018-04-11: 1000 mL via INTRAVENOUS

## 2018-04-11 MED ORDER — METHOCARBAMOL 500 MG PO TABS: 500.0000 mg | ORAL_TABLET | Freq: Two times a day (BID) | ORAL | 0 refills | Status: DC

## 2018-04-11 MED ORDER — HYDROMORPHONE HCL 1 MG/ML IJ SOLN
0.5000 mg | Freq: Once | INTRAMUSCULAR | Status: AC
  Administered 2018-04-11: 0.5 mg via INTRAVENOUS
  Filled 2018-04-11: qty 1

## 2018-04-11 MED ORDER — ONDANSETRON HCL 4 MG/2ML IJ SOLN
4.0000 mg | Freq: Once | INTRAMUSCULAR | Status: AC
  Administered 2018-04-11: 4 mg via INTRAVENOUS
  Filled 2018-04-11: qty 2

## 2018-04-11 MED ORDER — OXYCODONE-ACETAMINOPHEN 5-325 MG PO TABS
1.0000 | ORAL_TABLET | Freq: Three times a day (TID) | ORAL | 0 refills | Status: AC | PRN
Start: 2018-04-11 — End: 2018-04-14

## 2018-04-11 MED FILL — OXYCODONE-ACETAMINOPHEN 5-3: 5-325 | 2 days supply | Qty: 10 | Fill #0

## 2018-04-11 MED FILL — METHOCARBAMOL 500 MG TABS: 500 | 10 days supply | Qty: 20 | Fill #0

## 2018-04-11 NOTE — ED Notes (Addendum)
SN wasted 0.5mg  of dilaudid in sharps; waste verified with Kara Dies, RN
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
## 2018-04-11 NOTE — ED Notes (Signed)
Pt discharged from ED; instructions provided and scripts given; Pt encouraged to return to ED if symptoms worsen and to f/u with PCP; Pt verbalized understanding of all instructions 
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
## 2018-04-11 NOTE — ED Notes (Signed)
Patient transported to X-ray 

## 2018-04-11 NOTE — ED Notes (Signed)
Patient transported to CT 

## 2018-04-11 NOTE — ED Notes (Signed)
ED Provider at bedside. 

## 2018-04-11 NOTE — Progress Notes (Signed)
Spoke with pt for pre-op call. Pt denies cardiac history, HTN or diabetes. Pt has a late surgery scheduled, I spoke with Dr. Roanna Banning, Anesthesiologist and he stated pt could be NPO after 8:30 AM Wednesday,  04/12/18.

## 2018-04-11 NOTE — Discharge Instructions (Addendum)
As we discussed, you will be seeing Dr. Amedeo Plenty in his office tomorrow at 12pm for an appointment. Please do not eat or drink anything after 12am tonight.   Keep the arm elevated to help with swelling.   You can take 1000 mg of Tylenol.  Do not exceed 4000 mg of Tylenol a day. You can take the pain medication for severe or breakthrough pain.   Take Robaxin as prescribed. This medication will make you drowsy so do not drive or drink alcohol when taking it.  Return to the Emergency Department for any worsening pain, discoloration of fingers, numbness/weakness, chest pain, difficulty breathing, abdominal pain, vomiting, neck pain or any other worsening or concerning symptoms.  As we discussed, there was a small area on your thyroid that was noticed on your CT scan. Please have your primary care doctor follow up with this.

## 2018-04-12 ENCOUNTER — Encounter (HOSPITAL_COMMUNITY)
Admission: RE | Disposition: A | Discharge status: Home / Self Care | Payer: Self-pay | Source: Ambulatory Visit | Attending: Orthopedic Surgery

## 2018-04-12 ENCOUNTER — Ambulatory Visit (HOSPITAL_COMMUNITY): Payer: 59 | Admitting: Anesthesiology

## 2018-04-12 ENCOUNTER — Encounter (HOSPITAL_COMMUNITY): Payer: Self-pay

## 2018-04-12 ENCOUNTER — Other Ambulatory Visit: Payer: Self-pay

## 2018-04-12 ENCOUNTER — Observation Stay (HOSPITAL_COMMUNITY)
Admission: RE | Admit: 2018-04-12 | Discharge: 2018-04-13 | Disposition: A | Discharge status: Home / Self Care | Payer: 59 | Source: Ambulatory Visit | Attending: Orthopedic Surgery | Admitting: Orthopedic Surgery

## 2018-04-12 DIAGNOSIS — S52501A Unspecified fracture of the lower end of right radius, initial encounter for closed fracture: Secondary | ICD-10-CM | POA: Diagnosis present

## 2018-04-12 DIAGNOSIS — F329 Major depressive disorder, single episode, unspecified: Secondary | ICD-10-CM | POA: Diagnosis not present

## 2018-04-12 DIAGNOSIS — M25531 Pain in right wrist: Secondary | ICD-10-CM | POA: Diagnosis not present

## 2018-04-12 DIAGNOSIS — Z880 Allergy status to penicillin: Secondary | ICD-10-CM | POA: Insufficient documentation

## 2018-04-12 DIAGNOSIS — Z87891 Personal history of nicotine dependence: Secondary | ICD-10-CM | POA: Diagnosis not present

## 2018-04-12 DIAGNOSIS — R05 Cough: Secondary | ICD-10-CM | POA: Diagnosis not present

## 2018-04-12 DIAGNOSIS — Z881 Allergy status to other antibiotic agents status: Secondary | ICD-10-CM | POA: Diagnosis not present

## 2018-04-12 DIAGNOSIS — S52571A Other intraarticular fracture of lower end of right radius, initial encounter for closed fracture: Secondary | ICD-10-CM | POA: Diagnosis not present

## 2018-04-12 DIAGNOSIS — S52531A Colles' fracture of right radius, initial encounter for closed fracture: Secondary | ICD-10-CM | POA: Diagnosis not present

## 2018-04-12 DIAGNOSIS — S52591A Other fractures of lower end of right radius, initial encounter for closed fracture: Secondary | ICD-10-CM | POA: Diagnosis not present

## 2018-04-12 DIAGNOSIS — F419 Anxiety disorder, unspecified: Secondary | ICD-10-CM | POA: Insufficient documentation

## 2018-04-12 DIAGNOSIS — N9089 Other specified noninflammatory disorders of vulva and perineum: Secondary | ICD-10-CM | POA: Diagnosis not present

## 2018-04-12 DIAGNOSIS — S52601A Unspecified fracture of lower end of right ulna, initial encounter for closed fracture: Secondary | ICD-10-CM

## 2018-04-12 HISTORY — DX: Iron deficiency: E61.1

## 2018-04-12 HISTORY — DX: Personal history of urinary calculi: Z87.442

## 2018-04-12 HISTORY — DX: Nausea with vomiting, unspecified: R11.2

## 2018-04-12 HISTORY — DX: Anxiety disorder, unspecified: F41.9

## 2018-04-12 HISTORY — DX: Headache: R51

## 2018-04-12 HISTORY — DX: Other specified postprocedural states: Z98.890

## 2018-04-12 HISTORY — DX: Pneumonia, unspecified organism: J18.9

## 2018-04-12 HISTORY — DX: Headache, unspecified: R51.9

## 2018-04-12 HISTORY — PX: OPEN REDUCTION INTERNAL FIXATION (ORIF) DISTAL RADIAL FRACTURE: SHX5989

## 2018-04-12 SURGERY — OPEN REDUCTION INTERNAL FIXATION (ORIF) DISTAL RADIUS FRACTURE
Anesthesia: Regional | Laterality: Right

## 2018-04-12 MED ORDER — LIDOCAINE 2% (20 MG/ML) 5 ML SYRINGE
INTRAMUSCULAR | Status: AC
  Filled 2018-04-12: qty 5

## 2018-04-12 MED ORDER — VANCOMYCIN HCL IN DEXTROSE 1-5 GM/200ML-% IV SOLN
1000.0000 mg | INTRAVENOUS | Status: DC
  Administered 2018-04-12: 1000 mg via INTRAVENOUS
  Filled 2018-04-12: qty 200

## 2018-04-12 MED ORDER — SODIUM CHLORIDE 0.9 % IR SOLN
Status: DC | PRN
  Administered 2018-04-12: 1000 mL

## 2018-04-12 MED ORDER — DEXAMETHASONE SODIUM PHOSPHATE 10 MG/ML IJ SOLN
INTRAMUSCULAR | Status: AC
  Filled 2018-04-12: qty 1

## 2018-04-12 MED ORDER — ONDANSETRON HCL 4 MG/2ML IJ SOLN
INTRAMUSCULAR | Status: DC | PRN
  Administered 2018-04-12: 4 mg via INTRAVENOUS

## 2018-04-12 MED ORDER — ALPRAZOLAM 0.5 MG PO TABS: 0.5000 mg | ORAL_TABLET | Freq: Four times a day (QID) | ORAL | Status: DC | PRN

## 2018-04-12 MED ORDER — FENTANYL CITRATE (PF) 100 MCG/2ML IJ SOLN
INTRAMUSCULAR | Status: DC | PRN
  Administered 2018-04-12: 100 ug via INTRAVENOUS

## 2018-04-12 MED ORDER — CEFAZOLIN SODIUM-DEXTROSE 2-4 GM/100ML-% IV SOLN: 2.0000 g | INTRAVENOUS | Status: DC

## 2018-04-12 MED ORDER — MEPERIDINE HCL 50 MG/ML IJ SOLN: 6.2500 mg | INTRAMUSCULAR | Status: DC | PRN

## 2018-04-12 MED ORDER — CHLORHEXIDINE GLUCONATE 4 % EX LIQD: 60.0000 mL | Freq: Once | CUTANEOUS | Status: DC

## 2018-04-12 MED ORDER — FENTANYL CITRATE (PF) 100 MCG/2ML IJ SOLN
100.0000 ug | Freq: Once | INTRAMUSCULAR | Status: AC
  Administered 2018-04-12: 100 ug via INTRAVENOUS

## 2018-04-12 MED ORDER — MIDAZOLAM HCL 2 MG/2ML IJ SOLN
INTRAMUSCULAR | Status: DC | PRN
  Administered 2018-04-12: 2 mg via INTRAVENOUS

## 2018-04-12 MED ORDER — DEXAMETHASONE SODIUM PHOSPHATE 10 MG/ML IJ SOLN
INTRAMUSCULAR | Status: DC | PRN
  Administered 2018-04-12: 10 mg via INTRAVENOUS

## 2018-04-12 MED ORDER — PROPOFOL 10 MG/ML IV BOLUS
INTRAVENOUS | Status: DC | PRN
  Administered 2018-04-12: 30 mg via INTRAVENOUS

## 2018-04-12 MED ORDER — METHOCARBAMOL 500 MG PO TABS
500.0000 mg | ORAL_TABLET | Freq: Four times a day (QID) | ORAL | Status: DC | PRN
  Administered 2018-04-12 – 2018-04-13 (×3): 500 mg via ORAL
  Filled 2018-04-12 (×3): qty 1

## 2018-04-12 MED ORDER — PHENYLEPHRINE 40 MCG/ML (10ML) SYRINGE FOR IV PUSH (FOR BLOOD PRESSURE SUPPORT)
PREFILLED_SYRINGE | INTRAVENOUS | Status: AC
  Filled 2018-04-12: qty 10

## 2018-04-12 MED ORDER — MIDAZOLAM HCL 2 MG/2ML IJ SOLN
2.0000 mg | Freq: Once | INTRAMUSCULAR | Status: AC
  Administered 2018-04-12: 2 mg via INTRAVENOUS

## 2018-04-12 MED ORDER — ONDANSETRON HCL 4 MG/2ML IJ SOLN: 4.0000 mg | Freq: Once | INTRAMUSCULAR | Status: DC | PRN

## 2018-04-12 MED ORDER — PROMETHAZINE HCL 12.5 MG RE SUPP
12.5000 mg | Freq: Four times a day (QID) | RECTAL | Status: DC | PRN
  Filled 2018-04-12: qty 1

## 2018-04-12 MED ORDER — TAPENTADOL HCL 50 MG PO TABS: 100.0000 mg | ORAL_TABLET | ORAL | Status: DC | PRN

## 2018-04-12 MED ORDER — ONDANSETRON HCL 4 MG/2ML IJ SOLN
INTRAMUSCULAR | Status: AC
  Filled 2018-04-12: qty 2

## 2018-04-12 MED ORDER — PROPOFOL 10 MG/ML IV BOLUS
INTRAVENOUS | Status: AC
  Filled 2018-04-12: qty 20

## 2018-04-12 MED ORDER — PHENYLEPHRINE 40 MCG/ML (10ML) SYRINGE FOR IV PUSH (FOR BLOOD PRESSURE SUPPORT)
PREFILLED_SYRINGE | INTRAVENOUS | Status: DC | PRN
  Administered 2018-04-12: 80 ug via INTRAVENOUS

## 2018-04-12 MED ORDER — METHOCARBAMOL 1000 MG/10ML IJ SOLN
500.0000 mg | Freq: Four times a day (QID) | INTRAVENOUS | Status: DC | PRN
  Filled 2018-04-12: qty 5

## 2018-04-12 MED ORDER — FENTANYL CITRATE (PF) 100 MCG/2ML IJ SOLN
INTRAMUSCULAR | Status: AC
  Administered 2018-04-12: 100 ug via INTRAVENOUS
  Filled 2018-04-12: qty 2

## 2018-04-12 MED ORDER — HYDROMORPHONE HCL 1 MG/ML IJ SOLN
0.5000 mg | INTRAMUSCULAR | Status: DC | PRN
  Administered 2018-04-13 (×2): 1 mg via INTRAVENOUS
  Filled 2018-04-12 (×2): qty 1

## 2018-04-12 MED ORDER — MIDAZOLAM HCL 2 MG/2ML IJ SOLN
INTRAMUSCULAR | Status: AC
  Administered 2018-04-12: 2 mg via INTRAVENOUS
  Filled 2018-04-12: qty 2

## 2018-04-12 MED ORDER — BUPIVACAINE-EPINEPHRINE (PF) 0.5% -1:200000 IJ SOLN
INTRAMUSCULAR | Status: DC | PRN
  Administered 2018-04-12: 30 mL via PERINEURAL

## 2018-04-12 MED ORDER — DOCUSATE SODIUM 100 MG PO CAPS
100.0000 mg | ORAL_CAPSULE | Freq: Two times a day (BID) | ORAL | Status: DC
  Administered 2018-04-12 – 2018-04-13 (×2): 100 mg via ORAL
  Filled 2018-04-12 (×2): qty 1

## 2018-04-12 MED ORDER — LACTATED RINGERS IV SOLN: INTRAVENOUS | Status: DC

## 2018-04-12 MED ORDER — LACTATED RINGERS IV SOLN
INTRAVENOUS | Status: DC | PRN
  Administered 2018-04-12: 18:00:00 via INTRAVENOUS

## 2018-04-12 MED ORDER — FENTANYL CITRATE (PF) 250 MCG/5ML IJ SOLN
INTRAMUSCULAR | Status: AC
  Filled 2018-04-12: qty 5

## 2018-04-12 MED ORDER — ONDANSETRON HCL 4 MG PO TABS: 4.0000 mg | ORAL_TABLET | Freq: Four times a day (QID) | ORAL | Status: DC | PRN

## 2018-04-12 MED ORDER — HYDROMORPHONE HCL 1 MG/ML IJ SOLN: 0.2500 mg | INTRAMUSCULAR | Status: DC | PRN

## 2018-04-12 MED ORDER — ONDANSETRON HCL 4 MG/2ML IJ SOLN
4.0000 mg | Freq: Four times a day (QID) | INTRAMUSCULAR | Status: DC | PRN
  Administered 2018-04-13: 4 mg via INTRAVENOUS
  Filled 2018-04-12: qty 2

## 2018-04-12 MED ORDER — OXYCODONE HCL 5 MG PO TABS
5.0000 mg | ORAL_TABLET | ORAL | Status: DC | PRN
  Administered 2018-04-13 (×4): 10 mg via ORAL
  Filled 2018-04-12 (×4): qty 2

## 2018-04-12 MED ORDER — FAMOTIDINE 20 MG PO TABS: 20.0000 mg | ORAL_TABLET | Freq: Two times a day (BID) | ORAL | Status: DC | PRN

## 2018-04-12 MED ORDER — VANCOMYCIN HCL 500 MG IV SOLR
500.0000 mg | Freq: Two times a day (BID) | INTRAVENOUS | Status: DC
  Administered 2018-04-13: 500 mg via INTRAVENOUS
  Filled 2018-04-12 (×2): qty 500

## 2018-04-12 MED ORDER — PROPOFOL 500 MG/50ML IV EMUL
INTRAVENOUS | Status: DC | PRN
  Administered 2018-04-12: 100 ug/kg/min via INTRAVENOUS

## 2018-04-12 MED ORDER — MIDAZOLAM HCL 2 MG/2ML IJ SOLN
INTRAMUSCULAR | Status: AC
  Filled 2018-04-12: qty 2

## 2018-04-12 MED ORDER — VITAMIN C 500 MG PO TABS
1000.0000 mg | ORAL_TABLET | Freq: Every day | ORAL | Status: DC
  Administered 2018-04-12 – 2018-04-13 (×2): 1000 mg via ORAL
  Filled 2018-04-12 (×2): qty 2

## 2018-04-12 SURGICAL SUPPLY — 54 items
BANDAGE ACE 3X5.8 VEL STRL LF (GAUZE/BANDAGES/DRESSINGS) ×4 IMPLANT
BANDAGE ACE 4X5 VEL STRL LF (GAUZE/BANDAGES/DRESSINGS) ×1 IMPLANT
BIT DRILL 2.2 SS TIBIAL (BIT) ×1 IMPLANT
BLADE CLIPPER SURG (BLADE) IMPLANT
BNDG CMPR 9X4 STRL LF SNTH (GAUZE/BANDAGES/DRESSINGS) ×1
BNDG ESMARK 4X9 LF (GAUZE/BANDAGES/DRESSINGS) ×2 IMPLANT
BNDG GAUZE ELAST 4 BULKY (GAUZE/BANDAGES/DRESSINGS) ×4 IMPLANT
CORDS BIPOLAR (ELECTRODE) ×2 IMPLANT
COVER SURGICAL LIGHT HANDLE (MISCELLANEOUS) ×2 IMPLANT
CUFF TOURNIQUET SINGLE 18IN (TOURNIQUET CUFF) ×2 IMPLANT
CUFF TOURNIQUET SINGLE 24IN (TOURNIQUET CUFF) IMPLANT
DRAIN TLS ROUND 10FR (DRAIN) IMPLANT
DRAPE OEC MINIVIEW 54X84 (DRAPES) IMPLANT
DRAPE U-SHAPE 47X51 STRL (DRAPES) ×2 IMPLANT
DRSG ADAPTIC 3X8 NADH LF (GAUZE/BANDAGES/DRESSINGS) ×2 IMPLANT
GAUZE SPONGE 4X4 12PLY STRL (GAUZE/BANDAGES/DRESSINGS) ×2 IMPLANT
GAUZE XEROFORM 5X9 LF (GAUZE/BANDAGES/DRESSINGS) ×2 IMPLANT
GLOVE BIOGEL M 8.0 STRL (GLOVE) ×2 IMPLANT
GLOVE SS BIOGEL STRL SZ 8 (GLOVE) ×1 IMPLANT
GLOVE SUPERSENSE BIOGEL SZ 8 (GLOVE) ×1
GOWN STRL REUS W/ TWL LRG LVL3 (GOWN DISPOSABLE) ×3 IMPLANT
GOWN STRL REUS W/ TWL XL LVL3 (GOWN DISPOSABLE) ×3 IMPLANT
GOWN STRL REUS W/TWL LRG LVL3 (GOWN DISPOSABLE)
GOWN STRL REUS W/TWL XL LVL3 (GOWN DISPOSABLE) ×2
KIT BASIN OR (CUSTOM PROCEDURE TRAY) ×2 IMPLANT
KIT TURNOVER KIT B (KITS) ×2 IMPLANT
MANIFOLD NEPTUNE II (INSTRUMENTS) ×2 IMPLANT
NEEDLE 22X1 1/2 (OR ONLY) (NEEDLE) IMPLANT
NS IRRIG 1000ML POUR BTL (IV SOLUTION) ×2 IMPLANT
PACK ORTHO EXTREMITY (CUSTOM PROCEDURE TRAY) ×2 IMPLANT
PAD ARMBOARD 7.5X6 YLW CONV (MISCELLANEOUS) ×4 IMPLANT
PAD CAST 4YDX4 CTTN HI CHSV (CAST SUPPLIES) ×3 IMPLANT
PADDING CAST COTTON 4X4 STRL (CAST SUPPLIES) ×2
PEG THREADED 2.5MMX16MM LONG (Peg) ×1 IMPLANT
PEG THREADED 2.5MMX18MM LONG (Peg) ×2 IMPLANT
PEG THREADED 2.5MMX20MM LONG (Peg) ×3 IMPLANT
PLATE STAN 21.6X57.2 NRRW RT (Plate) ×1 IMPLANT
PUTTY DBM STAGRAFT PLUS 2CC (Putty) ×1 IMPLANT
SCREW LOCK 14X2.7X 3 LD TPR (Screw) IMPLANT
SCREW LOCK 18X2.7X 3 LD TPR (Screw) IMPLANT
SCREW LOCKING 2.7X13MM (Screw) ×3 IMPLANT
SCREW LOCKING 2.7X14 (Screw) ×6 IMPLANT
SCREW LOCKING 2.7X15MM (Screw) ×1 IMPLANT
SCREW LOCKING 2.7X18 (Screw) ×2 IMPLANT
SCRUB BETADINE 4OZ XXX (MISCELLANEOUS) ×2 IMPLANT
SOL PREP POV-IOD 4OZ 10% (MISCELLANEOUS) ×2 IMPLANT
SUT MNCRL AB 4-0 PS2 18 (SUTURE) ×1 IMPLANT
SUT PROLENE 3 0 PS 2 (SUTURE) IMPLANT
SUT PROLENE 4 0 PS 2 18 (SUTURE) IMPLANT
SUT VIC AB 3-0 FS2 27 (SUTURE) IMPLANT
SYR CONTROL 10ML LL (SYRINGE) IMPLANT
SYSTEM CHEST DRAIN TLS 7FR (DRAIN) IMPLANT
TOWEL OR 17X26 10 PK STRL BLUE (TOWEL DISPOSABLE) ×2 IMPLANT
UNDERPAD 30X30 (UNDERPADS AND DIAPERS) ×2 IMPLANT

## 2018-04-12 NOTE — Transfer of Care (Signed)
Immediate Anesthesia Transfer of Care Note  Patient: Dana Sanders  Procedure(s) Performed: OPEN REDUCTION INTERNAL FIXATION (ORIF) DISTAL RADIAL FRACTURE (Right )  Patient Location: PACU  Anesthesia Type:MAC combined with regional for post-op pain  Level of Consciousness: awake, alert , oriented and patient cooperative  Airway & Oxygen Therapy: Patient Spontanous Breathing  Post-op Assessment: Report given to RN and Post -op Vital signs reviewed and stable  Post vital signs: Reviewed and stable  Last Vitals:  Vitals Value Taken Time  BP 107/78 04/12/2018  7:30 PM  Temp    Pulse 67 04/12/2018  7:31 PM  Resp 16 04/12/2018  7:31 PM  SpO2 97 % 04/12/2018  7:31 PM  Vitals shown include unvalidated device data.  Last Pain:  Vitals:   04/12/18 1715  TempSrc:   PainSc: 0-No pain      Patients Stated Pain Goal: 4 (03/52/48 1859)  Complications: No apparent anesthesia complications

## 2018-04-12 NOTE — Progress Notes (Signed)
Data charted under 1715 is not in correct column.  Correct time charted is 36. Jennell Corner RN

## 2018-04-12 NOTE — Addendum Note (Signed)
Addendum  created 04/12/18 2125 by Oletta Lamas, CRNA   Intraprocedure Meds edited

## 2018-04-12 NOTE — Op Note (Signed)
See dictation [phone] SP ORIF Right wrist Braxen Dobek MD

## 2018-04-12 NOTE — Anesthesia Preprocedure Evaluation (Signed)
Anesthesia Evaluation  Patient identified by MRN, date of birth, ID band Patient awake    Reviewed: Allergy & Precautions, NPO status , Patient's Chart, lab work & pertinent test results  History of Anesthesia Complications (+) PONV  Airway Mallampati: I  TM Distance: >3 FB Neck ROM: Full    Dental   Pulmonary former smoker,    Pulmonary exam normal        Cardiovascular Normal cardiovascular exam     Neuro/Psych Anxiety Depression    GI/Hepatic   Endo/Other    Renal/GU      Musculoskeletal   Abdominal   Peds  Hematology   Anesthesia Other Findings   Reproductive/Obstetrics                             Anesthesia Physical Anesthesia Plan  ASA: II  Anesthesia Plan: Regional   Post-op Pain Management:    Induction: Intravenous  PONV Risk Score and Plan: 3 and Ondansetron and Midazolam  Airway Management Planned: Simple Face Mask  Additional Equipment:   Intra-op Plan:   Post-operative Plan:   Informed Consent: I have reviewed the patients History and Physical, chart, labs and discussed the procedure including the risks, benefits and alternatives for the proposed anesthesia with the patient or authorized representative who has indicated his/her understanding and acceptance.     Plan Discussed with: CRNA and Surgeon  Anesthesia Plan Comments:         Anesthesia Quick Evaluation

## 2018-04-12 NOTE — Anesthesia Procedure Notes (Signed)
Anesthesia Regional Block: Supraclavicular block   Pre-Anesthetic Checklist: ,, timeout performed, Correct Patient, Correct Site, Correct Laterality, Correct Procedure, Correct Position, site marked, Risks and benefits discussed,  Surgical consent,  Pre-op evaluation,  At surgeon's request and post-op pain management  Laterality: Right  Prep: chloraprep       Needles:   Needle Type: Echogenic Stimulator Needle     Needle Length: 9cm  Needle Gauge: 21     Additional Needles:   Procedures:, nerve stimulator,,,,,,,   Nerve Stimulator or Paresthesia:  Response: 0.4 mA,   Additional Responses:   Narrative:  Start time: 04/12/2018 1:16 PM End time: 04/12/2018 1:26 PM Injection made incrementally with aspirations every 5 mL.  Performed by: Personally  Anesthesiologist: Lillia Abed, MD  Additional Notes: Monitors applied. Patient sedated. Sterile prep and drape,hand hygiene and sterile gloves were used. Relevant anatomy identified.Needle position confirmed.Local anesthetic injected incrementally after negative aspiration. Local anesthetic spread visualized around nerve(s). Vascular puncture avoided. No complications. Image printed for medical record.The patient tolerated the procedure well.

## 2018-04-12 NOTE — Progress Notes (Signed)
Pharmacy Antibiotic Note  Dana Sanders is a 57 y.o. female admitted on 04/12/2018 s/p hand surgery.  Pharmacy has been consulted for vancomycin dosing for surgical prophylaxis d/t penicillin allergy. Scr 1.09 on 7/23, est. crcl ~ 45-50 ml/min.   Per Dr. Amedeo Plenty, will continue for 36 hrs post op.  The pre-op dose vancomycin 1g was not charted. I called anesthesia, and confirmed the pre-op dose was given while pt was in short stay at around 1730.   Plan: - Vancomycin 500 mg IV Q 12 hrs x 2, next dose at 0600 tomorrow morning - Will not need to check level - Pharmacy sign off.   Height: 5\' 1"  (154.9 cm) Weight: 135 lb (61.2 kg) IBW/kg (Calculated) : 47.8  Temp (24hrs), Avg:98.1 F (36.7 C), Min:97.9 F (36.6 C), Max:98.4 F (36.9 C)  Recent Labs  Lab 04/11/18 0632  WBC 5.5  CREATININE 1.09*    Estimated Creatinine Clearance: 48.4 mL/min (A) (by C-G formula based on SCr of 1.09 mg/dL (H)).    Allergies  Allergen Reactions  . Other Anaphylaxis    FOOD ALLERGY: Anaphylactic reaction to MELONS  . Erythromycin Hives  . Penicillins Hives     Thank you for allowing pharmacy to be a part of this patient's care.  Maryanna Shape, PharmD, BCPS, BCPPS Clinical Pharmacist  Pager: [phone]  04/12/2018 8:57 PM

## 2018-04-12 NOTE — Anesthesia Postprocedure Evaluation (Signed)
Anesthesia Post Note  Patient: CAMILIA CAYWOOD  Procedure(s) Performed: OPEN REDUCTION INTERNAL FIXATION (ORIF) DISTAL RADIAL FRACTURE (Right )     Patient location during evaluation: PACU Anesthesia Type: Regional Level of consciousness: awake and alert Pain management: pain level controlled Vital Signs Assessment: post-procedure vital signs reviewed and stable Respiratory status: spontaneous breathing, nonlabored ventilation, respiratory function stable and patient connected to nasal cannula oxygen Cardiovascular status: stable and blood pressure returned to baseline Postop Assessment: no apparent nausea or vomiting Anesthetic complications: no    Last Vitals:  Vitals:   04/12/18 1945 04/12/18 2000  BP: 98/75 108/75  Pulse: 65 69  Resp: 11 11  Temp:  36.7 C  SpO2: 94% 95%    Last Pain:  Vitals:   04/12/18 2000  TempSrc:   PainSc: 0-No pain                 Effie Berkshire

## 2018-04-13 DIAGNOSIS — S52571A Other intraarticular fracture of lower end of right radius, initial encounter for closed fracture: Secondary | ICD-10-CM | POA: Diagnosis not present

## 2018-04-13 DIAGNOSIS — Z87891 Personal history of nicotine dependence: Secondary | ICD-10-CM | POA: Diagnosis not present

## 2018-04-13 DIAGNOSIS — F329 Major depressive disorder, single episode, unspecified: Secondary | ICD-10-CM | POA: Diagnosis not present

## 2018-04-13 DIAGNOSIS — Z881 Allergy status to other antibiotic agents status: Secondary | ICD-10-CM | POA: Diagnosis not present

## 2018-04-13 DIAGNOSIS — Z880 Allergy status to penicillin: Secondary | ICD-10-CM | POA: Diagnosis not present

## 2018-04-13 DIAGNOSIS — F419 Anxiety disorder, unspecified: Secondary | ICD-10-CM | POA: Diagnosis not present

## 2018-04-13 NOTE — Op Note (Signed)
Dana Sanders, [facility] MEDICAL RECORD [id number] ACCOUNT [id number] DATE OF BIRTH:August 05, 1961 FACILITY: [facility] LOCATION: [facility] PHYSICIAN:Ieasha Boerema M. Ayn Domangue, MD  OPERATIVE REPORT  DATE OF PROCEDURE:  04/12/2018  PREOPERATIVE DIAGNOSIS:  Comminuted complex greater than 5-part intraarticular distal radius fracture, right upper extremity.  POSTOPERATIVE DIAGNOSIS:  Comminuted complex greater than 5-part intraarticular distal radius fracture, right upper extremity.  PROCEDURE: 1.  Open reduction internal fixation distal radius fracture with an extended volar rim, DVR Crosslock plate.  This was an ORIF of the right radius intraarticular greater than five-part fracture. 2.  Five-view radiographic series, right wrist.  SURGEON:  Roseanne Kaufman, MD  ASSISTANT:  None.  COMPLICATIONS:  None.  ANESTHESIA:  IV sedation and block.  TOURNIQUET TIME:  Less than an hour.  DRAINS:  1  ESTIMATED BLOOD LOSS:  Minimal.  INDICATIONS FOR THE PROCEDURE:  This patient is a 57 year old female with the above-mentioned diagnosis.  I have counseled regarding the risks and benefits of surgery, and she desires to proceed with the above-mentioned operative intervention.  All  questions have been encouraged and answered.  OPERATIVE PROCEDURE:  The patient was seen by myself and anesthesia and taken to the operative suite and underwent smooth induction of IV sedation.  She was prepped with 2 Hibiclens scrubs performed by myself, followed by a 10-minute surgical Betadine  scrub, followed by sterile field being secured.  Arm was elevated.  Time out was called.  Preoperative vancomycin was given without incident, and the patient had the tourniquet insufflated.  A volar radial incision was made.  Dissection was carried down.   The FCR tendon sheath was incised dorsally and palmarly.  The FCR was retracted ulnarly with the contents of the carpal canal.  The pronator was incised, and I then piecemealed together  the fracture.  I was able to use orthopedic technique to achieve  adequate height, inclination and volar tilt about the radius and then applied a DVR extended volar rim Crosslock plate with StaGraft bone graft.  The patient tolerated this well.  I was able to achieve radiographic parameters nicely and was quite pleased  with her parameters and how she looked.  Following this, we then very carefully and cautiously performed irrigation followed by Prineo closure.  Screw lengths were checked.  All looked well.  Excellent range of motion about the wrist including the midcarpal, radiocarpal, and distal radioulnar  joint was verified radiographically and on objective examination.  No locking, popping, catching or instability signs or symptoms.  The pronator was closed nicely, a TLS drain was placed, and the wound was closed with Prolene.  This was an ORIF greater than five-part intraarticular fracture with a 5-view radiographic  series.  All went quite well.  Given the comminution and severity of her fracture, I was very pleased with the reduction efforts.  Nevertheless, she will have a higher propensity than the general population towards arthritis given the significant abnormality preoperatively.  Nevertheless, we will rehab her according to my standard protocol and move forward accordingly.  She will be admitted overnight for IV antibiotics and general postoperative observation.  All questions have been encouraged and answered.  LN/NUANCE  D:04/12/2018 T:04/12/2018 JOB:[id number]

## 2018-04-13 NOTE — Progress Notes (Signed)
Pt is anxious to go home. Right wrist with bulky dressing and a splint, dry and intact. Fingers are warm, and mobile. Pain is controlled. Discharge instructions was given to the pt, discharged home accompanied by Aunt.
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
## 2018-04-13 NOTE — Discharge Instructions (Signed)

## 2018-04-13 NOTE — Evaluation (Signed)
Occupational Therapy Evaluation and Discharge Patient Details Name: Dana Sanders MRN: [id number] DOB: 1960-10-27 Today's Date: 04/13/2018    History of Present Illness Pt is a 57 y.o. female s/p ORIF Right Distal Radial Fracture. PMHx: Depression, Lichen sclerosus, Bil shoulder sx, R finger arthrodesis 2014.   Clinical Impression   Pt reports she was independent with ADL and mobility PTA. Currently pt mod I with ADL and functional mobility. Educated pt on edema management strategies and compensatory strategies for ADL. Pt planning to d/c home with supervision from family. No further acute OT needs identified; signing off at this time. Please re-consult if needs change. Thank you for this referral.  OF NOTE: Pt c/o severe R shoulder pain pre-op; able to perform full AROM today. Reports nerve block still intact, therefore pain better today but still reporting discomfort with movement.    Follow Up Recommendations  No OT follow up;Supervision - Intermittent    Equipment Recommendations  None recommended by OT    Recommendations for Other Services       Precautions / Restrictions Restrictions Weight Bearing Restrictions: No      Mobility Bed Mobility Overal bed mobility: Modified Independent                Transfers Overall transfer level: Modified independent Equipment used: None             General transfer comment: Assist for line management    Balance Overall balance assessment: No apparent balance deficits (not formally assessed)                                         ADL either performed or assessed with clinical judgement   ADL Overall ADL's : Modified independent                                       General ADL Comments: Educated pt on edema management strategies--elevation, ice, retrograde massage, and digit ROM. Educated pt on UB bathing and dressing techniques; pt verbalized understanding.     Vision         Perception     Praxis      Pertinent Vitals/Pain Pain Assessment: Faces Faces Pain Scale: Hurts even more Pain Location: R shoulder Pain Descriptors / Indicators: Discomfort;Sharp Pain Intervention(s): Monitored during session;Repositioned;Ice applied     Hand Dominance Right   Extremity/Trunk Assessment Upper Extremity Assessment Upper Extremity Assessment: RUE deficits/detail RUE Deficits / Details: Pt c/o R shoulder pain; full AROM. Reports nerve block still intact somewhat so decreased pain with movement from yesterday. Able to wiggle fingers; increased edema at fingers and hand RUE: Unable to fully assess due to immobilization   Lower Extremity Assessment Lower Extremity Assessment: Overall WFL for tasks assessed   Cervical / Trunk Assessment Cervical / Trunk Assessment: Normal   Communication Communication Communication: No difficulties   Cognition Arousal/Alertness: Awake/alert Behavior During Therapy: WFL for tasks assessed/performed Overall Cognitive Status: Within Functional Limits for tasks assessed                                 General Comments: Emotional/tearful throguhout session but appropriate for situation.   General Comments       Exercises     Shoulder Instructions  Home Living Family/patient expects to be discharged to:: Private residence Living Arrangements: Parent Available Help at Discharge: Family Type of Home: House       Home Layout: Two level;Able to live on main level with bedroom/bathroom     Bathroom Shower/Tub: Teacher, early years/pre: Handicapped height     Home Equipment: Grab bars - toilet;Grab bars - tub/shower   Additional Comments: Pt is the primary caregiver for her father who has Alzheimers; does not have to provide physical assist--caregiver to help him with bathing      Prior Functioning/Environment Level of Independence: Independent        Comments: Works as a Designer, 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 [employer]        OT Problem List: Pain;Impaired UE functional use;Increased edema      OT Treatment/Interventions:      OT Goals(Current goals can be found in the care plan section) Acute Rehab OT Goals Patient Stated Goal: get better and return to work OT Goal Formulation: All assessment and education complete, DC therapy  OT Frequency:     Barriers to D/C:            Co-evaluation              AM-PAC PT "6 Clicks" Daily Activity     Outcome Measure Help from another person eating meals?: None Help from another person taking care of personal grooming?: None Help from another person toileting, which includes using toliet, bedpan, or urinal?: None Help from another person bathing (including washing, rinsing, drying)?: None Help from another person to put on and taking off regular upper body clothing?: None Help from another person to put on and taking off regular lower body clothing?: None 6 Click Score: 24   End of Session Nurse Communication: Mobility status  Activity Tolerance: Patient tolerated treatment well Patient left: in chair;with call bell/phone within reach  OT Visit Diagnosis: Pain Pain - Right/Left: Right Pain - part of body: Shoulder                Time: 4492-0100 OT Time Calculation (min): 27 min Charges:  OT General Charges $OT Visit: 1 Visit OT Evaluation $OT Eval Low Complexity: 1 Low OT Treatments $Self Care/Home Management : 8-22 mins  [name] A. Ulice Brilliant, M.S., OTR/L Acute Rehab Department: [phone]  Binnie Kand 04/13/2018, 11:45 AM

## 2018-04-14 ENCOUNTER — Encounter (HOSPITAL_COMMUNITY): Payer: Self-pay | Admitting: Orthopedic Surgery

## 2018-04-14 MED FILL — oxyCODONE HCL 5 MG TABS: 5 | 10 days supply | Qty: 40 | Fill #0

## 2018-04-14 MED FILL — traMADol HCL 50 MG TABS: 50 | 5 days supply | Qty: 40 | Fill #0
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
## 2018-04-19 DIAGNOSIS — F321 Major depressive disorder, single episode, moderate: Secondary | ICD-10-CM | POA: Diagnosis not present

## 2018-04-20 DIAGNOSIS — M25511 Pain in right shoulder: Secondary | ICD-10-CM | POA: Diagnosis not present

## 2018-04-26 DIAGNOSIS — M25519 Pain in unspecified shoulder: Secondary | ICD-10-CM | POA: Diagnosis not present

## 2018-04-26 DIAGNOSIS — Z4789 Encounter for other orthopedic aftercare: Secondary | ICD-10-CM | POA: Diagnosis not present

## 2018-04-26 DIAGNOSIS — M542 Cervicalgia: Secondary | ICD-10-CM | POA: Diagnosis not present

## 2018-04-26 DIAGNOSIS — M25511 Pain in right shoulder: Secondary | ICD-10-CM | POA: Diagnosis not present

## 2018-04-26 DIAGNOSIS — S52531D Colles' fracture of right radius, subsequent encounter for closed fracture with routine healing: Secondary | ICD-10-CM | POA: Diagnosis not present

## 2018-04-26 DIAGNOSIS — M25531 Pain in right wrist: Secondary | ICD-10-CM | POA: Diagnosis not present

## 2018-04-27 DIAGNOSIS — Z4789 Encounter for other orthopedic aftercare: Secondary | ICD-10-CM | POA: Diagnosis not present

## 2018-04-27 DIAGNOSIS — S52531D Colles' fracture of right radius, subsequent encounter for closed fracture with routine healing: Secondary | ICD-10-CM | POA: Diagnosis not present

## 2018-05-04 DIAGNOSIS — M67911 Unspecified disorder of synovium and tendon, right shoulder: Secondary | ICD-10-CM | POA: Diagnosis not present

## 2018-05-09 ENCOUNTER — Other Ambulatory Visit: Payer: Self-pay | Admitting: Orthopedic Surgery

## 2018-05-09 DIAGNOSIS — M67911 Unspecified disorder of synovium and tendon, right shoulder: Secondary | ICD-10-CM

## 2018-05-09 DIAGNOSIS — M25511 Pain in right shoulder: Secondary | ICD-10-CM | POA: Diagnosis not present

## 2018-05-10 DIAGNOSIS — S52531D Colles' fracture of right radius, subsequent encounter for closed fracture with routine healing: Secondary | ICD-10-CM | POA: Diagnosis not present

## 2018-05-10 DIAGNOSIS — M25631 Stiffness of right wrist, not elsewhere classified: Secondary | ICD-10-CM | POA: Diagnosis not present

## 2018-05-10 DIAGNOSIS — Z4789 Encounter for other orthopedic aftercare: Secondary | ICD-10-CM | POA: Diagnosis not present

## 2018-05-11 MED FILL — HYDROCODON-APAP 5-325: 5-325 | 7 days supply | Qty: 40 | Fill #0

## 2018-05-11 MED FILL — traMADol HCL 50 MG TABS: 50 | 5 days supply | Qty: 40 | Fill #0

## 2018-05-12 DIAGNOSIS — M75111 Incomplete rotator cuff tear or rupture of right shoulder, not specified as traumatic: Secondary | ICD-10-CM | POA: Diagnosis not present

## 2018-05-18 ENCOUNTER — Other Ambulatory Visit: Payer: Self-pay | Admitting: Orthopedic Surgery

## 2018-05-18 DIAGNOSIS — M25631 Stiffness of right wrist, not elsewhere classified: Secondary | ICD-10-CM | POA: Diagnosis not present

## 2018-05-25 DIAGNOSIS — M25631 Stiffness of right wrist, not elsewhere classified: Secondary | ICD-10-CM | POA: Diagnosis not present

## 2018-05-26 DIAGNOSIS — F321 Major depressive disorder, single episode, moderate: Secondary | ICD-10-CM | POA: Diagnosis not present

## 2018-05-31 ENCOUNTER — Other Ambulatory Visit: Payer: Self-pay

## 2018-05-31 ENCOUNTER — Encounter (HOSPITAL_BASED_OUTPATIENT_CLINIC_OR_DEPARTMENT_OTHER): Payer: Self-pay

## 2018-05-31 DIAGNOSIS — Z4789 Encounter for other orthopedic aftercare: Secondary | ICD-10-CM | POA: Diagnosis not present

## 2018-05-31 DIAGNOSIS — S52531D Colles' fracture of right radius, subsequent encounter for closed fracture with routine healing: Secondary | ICD-10-CM | POA: Diagnosis not present

## 2018-05-31 DIAGNOSIS — M25631 Stiffness of right wrist, not elsewhere classified: Secondary | ICD-10-CM | POA: Diagnosis not present

## 2018-06-08 ENCOUNTER — Encounter (HOSPITAL_BASED_OUTPATIENT_CLINIC_OR_DEPARTMENT_OTHER): Payer: Self-pay | Admitting: *Deleted

## 2018-06-08 ENCOUNTER — Encounter (HOSPITAL_BASED_OUTPATIENT_CLINIC_OR_DEPARTMENT_OTHER)
Admission: RE | Disposition: A | Discharge status: Home / Self Care | Payer: Self-pay | Source: Ambulatory Visit | Attending: Orthopedic Surgery

## 2018-06-08 ENCOUNTER — Ambulatory Visit (HOSPITAL_BASED_OUTPATIENT_CLINIC_OR_DEPARTMENT_OTHER)
Admission: RE | Admit: 2018-06-08 | Discharge: 2018-06-08 | Disposition: A | Discharge status: Home / Self Care | Payer: 59 | Source: Ambulatory Visit | Attending: Orthopedic Surgery | Admitting: Orthopedic Surgery

## 2018-06-08 ENCOUNTER — Ambulatory Visit (HOSPITAL_BASED_OUTPATIENT_CLINIC_OR_DEPARTMENT_OTHER): Payer: 59 | Admitting: Certified Registered"

## 2018-06-08 ENCOUNTER — Other Ambulatory Visit: Payer: Self-pay

## 2018-06-08 DIAGNOSIS — Z880 Allergy status to penicillin: Secondary | ICD-10-CM | POA: Diagnosis not present

## 2018-06-08 DIAGNOSIS — F419 Anxiety disorder, unspecified: Secondary | ICD-10-CM | POA: Diagnosis not present

## 2018-06-08 DIAGNOSIS — Z881 Allergy status to other antibiotic agents status: Secondary | ICD-10-CM | POA: Diagnosis not present

## 2018-06-08 DIAGNOSIS — L900 Lichen sclerosus et atrophicus: Secondary | ICD-10-CM | POA: Diagnosis not present

## 2018-06-08 DIAGNOSIS — Z79899 Other long term (current) drug therapy: Secondary | ICD-10-CM | POA: Insufficient documentation

## 2018-06-08 DIAGNOSIS — Z8249 Family history of ischemic heart disease and other diseases of the circulatory system: Secondary | ICD-10-CM | POA: Insufficient documentation

## 2018-06-08 DIAGNOSIS — G8918 Other acute postprocedural pain: Secondary | ICD-10-CM | POA: Diagnosis not present

## 2018-06-08 DIAGNOSIS — S46011A Strain of muscle(s) and tendon(s) of the rotator cuff of right shoulder, initial encounter: Secondary | ICD-10-CM | POA: Diagnosis not present

## 2018-06-08 DIAGNOSIS — F329 Major depressive disorder, single episode, unspecified: Secondary | ICD-10-CM | POA: Diagnosis not present

## 2018-06-08 DIAGNOSIS — M75121 Complete rotator cuff tear or rupture of right shoulder, not specified as traumatic: Secondary | ICD-10-CM | POA: Diagnosis not present

## 2018-06-08 DIAGNOSIS — Z87891 Personal history of nicotine dependence: Secondary | ICD-10-CM | POA: Diagnosis not present

## 2018-06-08 HISTORY — PX: SHOULDER ARTHROSCOPY WITH ROTATOR CUFF REPAIR: SHX5685

## 2018-06-08 SURGERY — ARTHROSCOPY, SHOULDER, WITH ROTATOR CUFF REPAIR
Anesthesia: General | Site: Shoulder | Laterality: Right

## 2018-06-08 MED ORDER — ACETAMINOPHEN 10 MG/ML IV SOLN
INTRAVENOUS | Status: DC | PRN
  Administered 2018-06-08: 1000 mg via INTRAVENOUS

## 2018-06-08 MED ORDER — MIDAZOLAM HCL 2 MG/2ML IJ SOLN
INTRAMUSCULAR | Status: AC
  Filled 2018-06-08: qty 2

## 2018-06-08 MED ORDER — ONDANSETRON HCL 4 MG/2ML IJ SOLN
INTRAMUSCULAR | Status: DC | PRN
  Administered 2018-06-08: 4 mg via INTRAVENOUS

## 2018-06-08 MED ORDER — ACETAMINOPHEN 10 MG/ML IV SOLN
INTRAVENOUS | Status: AC
  Filled 2018-06-08: qty 100

## 2018-06-08 MED ORDER — LACTATED RINGERS IV SOLN
INTRAVENOUS | Status: DC
  Administered 2018-06-08 (×2): via INTRAVENOUS

## 2018-06-08 MED ORDER — VANCOMYCIN HCL IN DEXTROSE 1-5 GM/200ML-% IV SOLN
INTRAVENOUS | Status: AC
  Filled 2018-06-08: qty 200

## 2018-06-08 MED ORDER — BUPIVACAINE HCL (PF) 0.5 % IJ SOLN
INTRAMUSCULAR | Status: DC | PRN
  Administered 2018-06-08: 15 mL via PERINEURAL

## 2018-06-08 MED ORDER — OXYCODONE-ACETAMINOPHEN 5-325 MG PO TABS: 1.0000 | ORAL_TABLET | ORAL | 0 refills | Status: DC | PRN

## 2018-06-08 MED ORDER — POVIDONE-IODINE 7.5 % EX SOLN: Freq: Once | CUTANEOUS | Status: DC

## 2018-06-08 MED ORDER — PROPOFOL 10 MG/ML IV BOLUS
INTRAVENOUS | Status: DC | PRN
  Administered 2018-06-08: 160 mg via INTRAVENOUS

## 2018-06-08 MED ORDER — EPHEDRINE SULFATE 50 MG/ML IJ SOLN
INTRAMUSCULAR | Status: DC | PRN
  Administered 2018-06-08: 10 mg via INTRAVENOUS
  Administered 2018-06-08: 5 mg via INTRAVENOUS
  Administered 2018-06-08: 10 mg via INTRAVENOUS
  Administered 2018-06-08: 5 mg via INTRAVENOUS

## 2018-06-08 MED ORDER — DEXAMETHASONE SODIUM PHOSPHATE 4 MG/ML IJ SOLN
INTRAMUSCULAR | Status: DC | PRN
  Administered 2018-06-08: 10 mg via INTRAVENOUS

## 2018-06-08 MED ORDER — SUCCINYLCHOLINE CHLORIDE 20 MG/ML IJ SOLN
INTRAMUSCULAR | Status: DC | PRN
  Administered 2018-06-08: 100 mg via INTRAVENOUS

## 2018-06-08 MED ORDER — SCOPOLAMINE 1 MG/3DAYS TD PT72: 1.0000 | MEDICATED_PATCH | Freq: Once | TRANSDERMAL | Status: DC | PRN

## 2018-06-08 MED ORDER — MIDAZOLAM HCL 2 MG/2ML IJ SOLN
1.0000 mg | INTRAMUSCULAR | Status: DC | PRN
  Administered 2018-06-08: 2 mg via INTRAVENOUS

## 2018-06-08 MED ORDER — LIDOCAINE 2% (20 MG/ML) 5 ML SYRINGE
INTRAMUSCULAR | Status: DC | PRN
  Administered 2018-06-08: 100 mg via INTRAVENOUS

## 2018-06-08 MED ORDER — FENTANYL CITRATE (PF) 100 MCG/2ML IJ SOLN
INTRAMUSCULAR | Status: AC
  Filled 2018-06-08: qty 2

## 2018-06-08 MED ORDER — BUPIVACAINE HCL (PF) 0.5 % IJ SOLN
INTRAMUSCULAR | Status: AC
  Filled 2018-06-08: qty 30

## 2018-06-08 MED ORDER — PROPOFOL 10 MG/ML IV BOLUS
INTRAVENOUS | Status: AC
  Filled 2018-06-08: qty 40

## 2018-06-08 MED ORDER — VANCOMYCIN HCL IN DEXTROSE 1-5 GM/200ML-% IV SOLN
1000.0000 mg | INTRAVENOUS | Status: AC
  Administered 2018-06-08: 1000 mg via INTRAVENOUS

## 2018-06-08 MED ORDER — FENTANYL CITRATE (PF) 100 MCG/2ML IJ SOLN
50.0000 ug | INTRAMUSCULAR | Status: DC | PRN
  Administered 2018-06-08: 100 ug via INTRAVENOUS

## 2018-06-08 MED ORDER — BUPIVACAINE LIPOSOME 1.3 % IJ SUSP
INTRAMUSCULAR | Status: DC | PRN
  Administered 2018-06-08: 10 mL

## 2018-06-08 MED FILL — OXYCODONE-ACETAMINOPHEN 5-3: 5-325 | 4 days supply | Qty: 40 | Fill #0

## 2018-06-08 MED FILL — PROMETHAZINE 25 MG TABLET: 25 | 7 days supply | Qty: 30 | Fill #0

## 2018-06-08 SURGICAL SUPPLY — 88 items
ADH SKN CLS APL DERMABOND .7 (GAUZE/BANDAGES/DRESSINGS)
AID PSTN UNV HD RSTRNT DISP (MISCELLANEOUS) ×1
ANCH SUT 2 SUTTK 14.5 STRL (Anchor) ×2 IMPLANT
ANCH SUT SWLK 19.1X4.75 VT (Anchor) ×2 IMPLANT
ANCHOR PEEK 4.75X19.1 SWLK C (Anchor) ×2 IMPLANT
ANCHOR STRTK 3X14.5 BIOC 1.3 (Anchor) IMPLANT
ANCHOR SUTURETAK 3X14.5 BIOC (Anchor) ×4 IMPLANT
APL SKNCLS STERI-STRIP NONHPOA (GAUZE/BANDAGES/DRESSINGS)
BENZOIN TINCTURE PRP APPL 2/3 (GAUZE/BANDAGES/DRESSINGS) IMPLANT
BLADE CLIPPER SURG (BLADE) IMPLANT
BLADE SURG 15 STRL LF DISP TIS (BLADE) IMPLANT
BLADE SURG 15 STRL SS (BLADE)
BUR OVAL 4.0 (BURR) ×2 IMPLANT
CANNULA 5.75X71 LONG (CANNULA) ×2 IMPLANT
CANNULA TWIST IN 8.25X7CM (CANNULA) ×1 IMPLANT
CHLORAPREP W/TINT 26ML (MISCELLANEOUS) ×2 IMPLANT
DECANTER SPIKE VIAL GLASS SM (MISCELLANEOUS) IMPLANT
DERMABOND ADVANCED (GAUZE/BANDAGES/DRESSINGS)
DERMABOND ADVANCED .7 DNX12 (GAUZE/BANDAGES/DRESSINGS) IMPLANT
DRAPE IMP U-DRAPE 54X76 (DRAPES) ×2 IMPLANT
DRAPE INCISE IOBAN 66X45 STRL (DRAPES) ×2 IMPLANT
DRAPE STERI 35X30 U-POUCH (DRAPES) ×2 IMPLANT
DRAPE SURG 17X23 STRL (DRAPES) ×2 IMPLANT
DRAPE U-SHAPE 47X51 STRL (DRAPES) ×2 IMPLANT
DRAPE U-SHAPE 76X120 STRL (DRAPES) ×4 IMPLANT
DRSG PAD ABDOMINAL 8X10 ST (GAUZE/BANDAGES/DRESSINGS) ×2 IMPLANT
ELECT REM PT RETURN 9FT ADLT (ELECTROSURGICAL) ×2
ELECTRODE REM PT RTRN 9FT ADLT (ELECTROSURGICAL) ×1 IMPLANT
GAUZE 4X4 16PLY RFD (DISPOSABLE) IMPLANT
GAUZE SPONGE 4X4 12PLY STRL (GAUZE/BANDAGES/DRESSINGS) ×2 IMPLANT
GAUZE XEROFORM 1X8 LF (GAUZE/BANDAGES/DRESSINGS) ×2 IMPLANT
GLOVE BIO SURGEON STRL SZ7 (GLOVE) ×3 IMPLANT
GLOVE BIO SURGEON STRL SZ7.5 (GLOVE) ×2 IMPLANT
GLOVE BIOGEL PI IND STRL 7.0 (GLOVE) ×1 IMPLANT
GLOVE BIOGEL PI IND STRL 8 (GLOVE) ×1 IMPLANT
GLOVE BIOGEL PI INDICATOR 7.0 (GLOVE) ×2
GLOVE BIOGEL PI INDICATOR 8 (GLOVE) ×1
GOWN STRL REUS W/ TWL LRG LVL3 (GOWN DISPOSABLE) ×2 IMPLANT
GOWN STRL REUS W/ TWL XL LVL3 (GOWN DISPOSABLE) ×1 IMPLANT
GOWN STRL REUS W/TWL LRG LVL3 (GOWN DISPOSABLE) ×4
GOWN STRL REUS W/TWL XL LVL3 (GOWN DISPOSABLE) ×2
KIT PERC INSERT 3.0 KNTLS (KITS) ×1 IMPLANT
LASSO CRESCENT QUICKPASS (SUTURE) ×1 IMPLANT
MANIFOLD NEPTUNE II (INSTRUMENTS) ×2 IMPLANT
NDL 1/2 CIR CATGUT .05X1.09 (NEEDLE) IMPLANT
NDL SCORPION MULTI FIRE (NEEDLE) IMPLANT
NDL SUT 6 .5 CRC .975X.05 MAYO (NEEDLE) IMPLANT
NEEDLE 1/2 CIR CATGUT .05X1.09 (NEEDLE) IMPLANT
NEEDLE MAYO TAPER (NEEDLE)
NEEDLE SCORPION MULTI FIRE (NEEDLE) IMPLANT
NS IRRIG 1000ML POUR BTL (IV SOLUTION) IMPLANT
PACK ARTHROSCOPY DSU (CUSTOM PROCEDURE TRAY) ×2 IMPLANT
PACK BASIN DAY SURGERY FS (CUSTOM PROCEDURE TRAY) ×2 IMPLANT
PENCIL BUTTON HOLSTER BLD 10FT (ELECTRODE) IMPLANT
PROBE BIPOLAR ATHRO 135MM 90D (MISCELLANEOUS) ×2 IMPLANT
RESECTOR FULL RADIUS 4.2MM (BLADE) ×1 IMPLANT
RESTRAINT HEAD UNIVERSAL NS (MISCELLANEOUS) ×2 IMPLANT
SLEEVE SCD COMPRESS KNEE MED (MISCELLANEOUS) ×2 IMPLANT
SLING ARM FOAM STRAP LRG (SOFTGOODS) IMPLANT
SLING ARM IMMOBILIZER MED (SOFTGOODS) IMPLANT
SLING ARM MED ADULT FOAM STRAP (SOFTGOODS) IMPLANT
SLING ARM XL FOAM STRAP (SOFTGOODS) IMPLANT
SPONGE LAP 4X18 RFD (DISPOSABLE) IMPLANT
STRIP CLOSURE SKIN 1/2X4 (GAUZE/BANDAGES/DRESSINGS) IMPLANT
SUCTION FRAZIER HANDLE 10FR (MISCELLANEOUS)
SUCTION TUBE FRAZIER 10FR DISP (MISCELLANEOUS) IMPLANT
SUPPORT WRAP ARM LG (MISCELLANEOUS) ×1 IMPLANT
SUT BONE WAX W31G (SUTURE) IMPLANT
SUT ETHILON 3 0 PS 1 (SUTURE) ×2 IMPLANT
SUT FIBERWIRE #2 38 T-5 BLUE (SUTURE)
SUT MNCRL AB 3-0 PS2 18 (SUTURE) IMPLANT
SUT MNCRL AB 4-0 PS2 18 (SUTURE) IMPLANT
SUT PDS AB 0 CT 36 (SUTURE) IMPLANT
SUT PROLENE 3 0 PS 2 (SUTURE) IMPLANT
SUT TIGER TAPE 7 IN WHITE (SUTURE) IMPLANT
SUT VIC AB 0 CT1 27 (SUTURE)
SUT VIC AB 0 CT1 27XBRD ANBCTR (SUTURE) IMPLANT
SUT VIC AB 2-0 SH 27 (SUTURE)
SUT VIC AB 2-0 SH 27XBRD (SUTURE) IMPLANT
SUTURE FIBERWR #2 38 T-5 BLUE (SUTURE) IMPLANT
SYR BULB 3OZ (MISCELLANEOUS) IMPLANT
TAPE FIBER 2MM 7IN #2 BLUE (SUTURE) ×1 IMPLANT
TOWEL GREEN STERILE FF (TOWEL DISPOSABLE) ×2 IMPLANT
TOWEL OR NON WOVEN STRL DISP B (DISPOSABLE) ×2 IMPLANT
TUBE CONNECTING 20X1/4 (TUBING) ×2 IMPLANT
TUBING ARTHROSCOPY IRRIG 16FT (MISCELLANEOUS) ×2 IMPLANT
WATER STERILE IRR 1000ML POUR (IV SOLUTION) ×2 IMPLANT
YANKAUER SUCT BULB TIP NO VENT (SUCTIONS) IMPLANT

## 2018-06-08 NOTE — Transfer of Care (Signed)
Immediate Anesthesia Transfer of Care Note  Patient: MELIS TROCHEZ  Procedure(s) Performed: SHOULDER ARTHROSCOPY WITH ROTATOR CUFF REPAIR SUPRASPENITIS AND SUBCAPSULARIS REPAIR (Right Shoulder)  Patient Location: PACU  Anesthesia Type:GA combined with regional for post-op pain  Level of Consciousness: sedated  Airway & Oxygen Therapy: Patient Spontanous Breathing and Patient connected to face mask oxygen  Post-op Assessment: Report given to RN and Post -op Vital signs reviewed and stable  Post vital signs: Reviewed and stable  Last Vitals:  Vitals Value Taken Time  BP 118/82 06/08/2018  1:38 PM  Temp    Pulse 105 06/08/2018  1:39 PM  Resp 21 06/08/2018  1:39 PM  SpO2 98 % 06/08/2018  1:39 PM  Vitals shown include unvalidated device data.  Last Pain:  Vitals:   06/08/18 1032  TempSrc: Oral  PainSc: 6       Patients Stated Pain Goal: 4 (44/92/01 0071)  Complications: No apparent anesthesia complications

## 2018-06-08 NOTE — Anesthesia Postprocedure Evaluation (Signed)
Anesthesia Post Note  Patient: Dana Sanders  Procedure(s) Performed: SHOULDER ARTHROSCOPY WITH ROTATOR CUFF REPAIR SUPRASPENATIS AND SUBCAPSULARIS REPAIR (Right Shoulder)     Patient location during evaluation: PACU Anesthesia Type: General Level of consciousness: awake and alert Pain management: pain level controlled Vital Signs Assessment: post-procedure vital signs reviewed and stable Respiratory status: spontaneous breathing, nonlabored ventilation and respiratory function stable Cardiovascular status: blood pressure returned to baseline and stable Postop Assessment: no apparent nausea or vomiting Anesthetic complications: no    Last Vitals:  Vitals:   06/08/18 1415 06/08/18 1440  BP: 114/77 110/71  Pulse: 84 79  Resp: 14 16  Temp:  36.6 C  SpO2: 98% 100%    Last Pain:  Vitals:   06/08/18 1440  TempSrc: Oral  PainSc: 0-No pain                 Lynda Rainwater

## 2018-06-08 NOTE — Anesthesia Procedure Notes (Signed)
Anesthesia Regional Block: Interscalene brachial plexus block   Pre-Anesthetic Checklist: ,, timeout performed, Correct Patient, Correct Site, Correct Laterality, Correct Procedure, Correct Position, site marked, Risks and benefits discussed,  Surgical consent,  Pre-op evaluation,  At surgeon's request and post-op pain management  Laterality: Right  Prep: chloraprep       Needles:  Injection technique: Single-shot  Needle Type: Stimulator Needle - 40     Needle Length: 4cm  Needle Gauge: 22     Additional Needles:   Procedures:,,,, ultrasound used (permanent image in chart),,,,  Narrative:  Start time: 06/08/2018 11:48 AM End time: 06/08/2018 11:53 AM Injection made incrementally with aspirations every 5 mL.  Performed by: Personally  Anesthesiologist: Nolon Nations, MD  Additional Notes: BP cuff, EKG monitors applied. Sedation begun. Nerve location verified with U/S. Anesthetic injected incrementally, slowly , and after neg aspirations under direct u/s guidance. Good perineural spread. Tolerated well.
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
## 2018-06-08 NOTE — Discharge Instructions (Signed)
Discharge Instructions after Arthroscopic Shoulder Repair   A sling has been provided for you. Remain in your sling at all times. This includes sleeping in your sling.  Use ice on the shoulder intermittently over the first 48 hours after surgery.  Pain medicine has been prescribed for you.  Use your medicine liberally over the first 48 hours, and then you can begin to taper your use. You may take Extra Strength Tylenol or Tylenol only in place of the pain pills. DO NOT take ANY nonsteroidal anti-inflammatory pain medications: Advil, Motrin, Ibuprofen, Aleve, Naproxen, or Narprosyn.  You may remove your dressing after two days. If the incision sites are still moist, place a Band-Aid over the moist site(s). Change Band-Aids daily until dry.  You may shower 5 days after surgery. The incisions CANNOT get wet prior to 5 days. Simply allow the water to wash over the site and then pat dry. Do not rub the incisions. Make sure your axilla (armpit) is completely dry after showering.  Take one aspirin a day for 2 weeks after surgery, unless you have an aspirin sensitivity/ allergy or asthma.   Please call [phone] during normal business hours or [phone] after hours for any problems. Including the following:  - excessive redness of the incisions - drainage for more than 4 days - fever of more than 101.5 F  *Please note that pain medications will not be refilled after hours or on weekends.   Post Anesthesia Home Care Instructions  Activity: Get plenty of rest for the remainder of the day. A responsible individual must stay with you for 24 hours following the procedure.  For the next 24 hours, DO NOT: -Drive a car -Operate machinery -Drink alcoholic beverages -Take any medication unless instructed by your physician -Make any legal decisions or sign important papers.  Meals: Start with liquid foods such as gelatin or soup. Progress to regular foods as tolerated. Avoid greasy, spicy, heavy  foods. If nausea and/or vomiting occur, drink only clear liquids until the nausea and/or vomiting subsides. Call your physician if vomiting continues.  Special Instructions/Symptoms: Your throat may feel dry or sore from the anesthesia or the breathing tube placed in your throat during surgery. If this causes discomfort, gargle with warm salt water. The discomfort should disappear within 24 hours.  If you had a scopolamine patch placed behind your ear for the management of post- operative nausea and/or vomiting:  1. The medication in the patch is effective for 72 hours, after which it should be removed.  Wrap patch in a tissue and discard in the trash. Wash hands thoroughly with soap and water. 2. You may remove the patch earlier than 72 hours if you experience unpleasant side effects which may include dry mouth, dizziness or visual disturbances. 3. Avoid touching the patch. Wash your hands with soap and water after contact with the patch.     Regional Anesthesia Blocks  1. Numbness or the inability to move the "blocked" extremity may last from 3-48 hours after placement. The length of time depends on the medication injected and your individual response to the medication. If the numbness is not going away after 48 hours, call your surgeon.  2. The extremity that is blocked will need to be protected until the numbness is gone and the  Strength has returned. Because you cannot feel it, you will need to take extra care to avoid injury. Because it may be weak, you may have difficulty moving it or using it. You may   not know what position it is in without looking at it while the block is in effect.  3. For blocks in the legs and feet, returning to weight bearing and walking needs to be done carefully. You will need to wait until the numbness is entirely gone and the strength has returned. You should be able to move your leg and foot normally before you try and bear weight or walk. You will need someone  to be with you when you first try to ensure you do not fall and possibly risk injury.  4. Bruising and tenderness at the needle site are common side effects and will resolve in a few days.  5. Persistent numbness or new problems with movement should be communicated to the surgeon or the [facility] ([phone])/ [facility] ([phone]).  Information for Discharge Teaching: EXPAREL (bupivacaine liposome injectable suspension)   Your surgeon or anesthesiologist gave you EXPAREL(bupivacaine) to help control your pain after surgery.   EXPAREL is a local anesthetic that provides pain relief by numbing the tissue around the surgical site.  EXPAREL is designed to release pain medication over time and can control pain for up to 72 hours.  Depending on how you respond to EXPAREL, you may require less pain medication during your recovery.  Possible side effects:  Temporary loss of sensation or ability to move in the area where bupivacaine was injected.  Nausea, vomiting, constipation  Rarely, numbness and tingling in your mouth or lips, lightheadedness, or anxiety may occur.  Call your doctor right away if you think you may be experiencing any of these sensations, or if you have other questions regarding possible side effects.  Follow all other discharge instructions given to you by your surgeon or nurse. Eat a healthy diet and drink plenty of water or other fluids.  If you return to the hospital for any reason within 96 hours following the administration of EXPAREL, it is important for health care providers to know that you have received this anesthetic. A teal colored band has been placed on your arm with the date, time and amount of EXPAREL you have received in order to alert and inform your health care providers. Please leave this armband in place for the full 96 hours following administration, and then you may remove the band.    

## 2018-06-08 NOTE — Anesthesia Preprocedure Evaluation (Signed)
Anesthesia Evaluation  Patient identified by MRN, date of birth, ID band Patient awake    Reviewed: Allergy & Precautions, NPO status , Patient's Chart, lab work & pertinent test results  History of Anesthesia Complications (+) PONV and history of anesthetic complications  Airway Mallampati: I  TM Distance: >3 FB Neck ROM: Full    Dental  (+) Dental Advisory Given   Pulmonary pneumonia, former smoker,    Pulmonary exam normal        Cardiovascular Normal cardiovascular exam Rhythm:Regular     Neuro/Psych  Headaches, PSYCHIATRIC DISORDERS Anxiety Depression    GI/Hepatic   Endo/Other    Renal/GU      Musculoskeletal   Abdominal   Peds  Hematology   Anesthesia Other Findings   Reproductive/Obstetrics                             Anesthesia Physical  Anesthesia Plan  ASA: II  Anesthesia Plan: General   Post-op Pain Management: GA combined w/ Regional for post-op pain   Induction: Intravenous  PONV Risk Score and Plan: 3 and Ondansetron and Midazolam  Airway Management Planned: Oral ETT  Additional Equipment:   Intra-op Plan:   Post-operative Plan: Extubation in OR  Informed Consent: I have reviewed the patients History and Physical, chart, labs and discussed the procedure including the risks, benefits and alternatives for the proposed anesthesia with the patient or authorized representative who has indicated his/her understanding and acceptance.   Dental advisory given  Plan Discussed with: CRNA  Anesthesia Plan Comments:         Anesthesia Quick Evaluation
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
## 2018-06-08 NOTE — Anesthesia Procedure Notes (Signed)
Procedure Name: Intubation Date/Time: 06/08/2018 12:13 PM Performed by: Maryella Shivers, CRNA Pre-anesthesia Checklist: Patient identified, Emergency Drugs available, Suction available and Patient being monitored Patient Re-evaluated:Patient Re-evaluated prior to induction Oxygen Delivery Method: Circle system utilized Preoxygenation: Pre-oxygenation with 100% oxygen Induction Type: IV induction Ventilation: Mask ventilation without difficulty Laryngoscope Size: Mac and 3 Grade View: Grade I Tube type: Oral Tube size: 7.0 mm Number of attempts: 1 Airway Equipment and Method: Stylet and Oral airway Placement Confirmation: ETT inserted through vocal cords under direct vision,  positive ETCO2 and breath sounds checked- equal and bilateral Secured at: 20 cm Tube secured with: Tape Dental Injury: Teeth and Oropharynx as per pre-operative assessment

## 2018-06-08 NOTE — Progress Notes (Signed)
Assisted Dr. Germeroth with right, ultrasound guided, supraclavicular block. Side rails up, monitors on throughout procedure. See vital signs in flow sheet. Tolerated Procedure well. 

## 2018-06-12 ENCOUNTER — Encounter (HOSPITAL_BASED_OUTPATIENT_CLINIC_OR_DEPARTMENT_OTHER): Payer: Self-pay | Admitting: Orthopedic Surgery

## 2018-06-16 DIAGNOSIS — Z9889 Other specified postprocedural states: Secondary | ICD-10-CM | POA: Diagnosis not present

## 2018-06-21 DIAGNOSIS — M25511 Pain in right shoulder: Secondary | ICD-10-CM | POA: Diagnosis not present

## 2018-06-21 DIAGNOSIS — M25519 Pain in unspecified shoulder: Secondary | ICD-10-CM | POA: Diagnosis not present

## 2018-06-21 MED FILL — SIMVASTATIN 10 MG TABLET: 10 | 90 days supply | Qty: 90 | Fill #2

## 2018-06-23 ENCOUNTER — Other Ambulatory Visit: Payer: Self-pay | Admitting: Geriatric Medicine

## 2018-06-23 ENCOUNTER — Ambulatory Visit
Admission: RE | Admit: 2018-06-23 | Discharge: 2018-06-23 | Disposition: A | Discharge status: Home / Self Care | Payer: 59 | Source: Ambulatory Visit | Attending: Geriatric Medicine | Admitting: Geriatric Medicine

## 2018-06-23 DIAGNOSIS — M48061 Spinal stenosis, lumbar region without neurogenic claudication: Secondary | ICD-10-CM | POA: Diagnosis not present

## 2018-06-23 DIAGNOSIS — R519 Headache, unspecified: Secondary | ICD-10-CM

## 2018-06-23 DIAGNOSIS — M542 Cervicalgia: Secondary | ICD-10-CM

## 2018-06-23 DIAGNOSIS — R51 Headache: Principal | ICD-10-CM

## 2018-06-23 DIAGNOSIS — G4452 New daily persistent headache (NDPH): Secondary | ICD-10-CM | POA: Diagnosis not present

## 2018-06-23 DIAGNOSIS — Z23 Encounter for immunization: Secondary | ICD-10-CM | POA: Diagnosis not present

## 2018-06-23 DIAGNOSIS — R42 Dizziness and giddiness: Secondary | ICD-10-CM | POA: Diagnosis not present

## 2018-06-23 MED FILL — LORazepam 0.5 MG TABS: 0.5 | 1 days supply | Qty: 1 | Fill #0

## 2018-06-23 MED FILL — ONDANSETRON HCL 8 MG TABLET: 8 | 2 days supply | Qty: 4 | Fill #0

## 2018-06-24 ENCOUNTER — Other Ambulatory Visit: Payer: 59

## 2018-06-27 DIAGNOSIS — M25631 Stiffness of right wrist, not elsewhere classified: Secondary | ICD-10-CM | POA: Diagnosis not present

## 2018-06-27 DIAGNOSIS — F321 Major depressive disorder, single episode, moderate: Secondary | ICD-10-CM | POA: Diagnosis not present

## 2018-06-28 DIAGNOSIS — S52531D Colles' fracture of right radius, subsequent encounter for closed fracture with routine healing: Secondary | ICD-10-CM | POA: Diagnosis not present

## 2018-06-28 DIAGNOSIS — Z4789 Encounter for other orthopedic aftercare: Secondary | ICD-10-CM | POA: Diagnosis not present

## 2018-06-28 DIAGNOSIS — M25511 Pain in right shoulder: Secondary | ICD-10-CM | POA: Diagnosis not present

## 2018-06-29 ENCOUNTER — Other Ambulatory Visit: Payer: 59

## 2018-07-04 ENCOUNTER — Other Ambulatory Visit: Payer: 59

## 2018-07-05 DIAGNOSIS — M25631 Stiffness of right wrist, not elsewhere classified: Secondary | ICD-10-CM | POA: Diagnosis not present

## 2018-07-11 MED FILL — traMADol HCL 50 MG TABS: 50 | 10 days supply | Qty: 40 | Fill #0

## 2018-07-12 DIAGNOSIS — M25511 Pain in right shoulder: Secondary | ICD-10-CM | POA: Diagnosis not present

## 2018-07-12 DIAGNOSIS — M25631 Stiffness of right wrist, not elsewhere classified: Secondary | ICD-10-CM | POA: Diagnosis not present

## 2018-07-14 ENCOUNTER — Ambulatory Visit
Admission: RE | Admit: 2018-07-14 | Discharge: 2018-07-14 | Disposition: A | Discharge status: Home / Self Care | Payer: 59 | Source: Ambulatory Visit | Attending: Geriatric Medicine | Admitting: Geriatric Medicine

## 2018-07-14 DIAGNOSIS — R519 Headache, unspecified: Secondary | ICD-10-CM

## 2018-07-14 DIAGNOSIS — R51 Headache: Principal | ICD-10-CM

## 2018-07-17 DIAGNOSIS — F321 Major depressive disorder, single episode, moderate: Secondary | ICD-10-CM | POA: Diagnosis not present

## 2018-07-17 DIAGNOSIS — Z9889 Other specified postprocedural states: Secondary | ICD-10-CM | POA: Diagnosis not present

## 2018-07-18 DIAGNOSIS — M25511 Pain in right shoulder: Secondary | ICD-10-CM | POA: Diagnosis not present

## 2018-07-18 DIAGNOSIS — M25631 Stiffness of right wrist, not elsewhere classified: Secondary | ICD-10-CM | POA: Diagnosis not present

## 2018-07-20 MED FILL — GABAPENTIN 100 MG CAP: 100 | 30 days supply | Qty: 90 | Fill #0

## 2018-07-21 DIAGNOSIS — H524 Presbyopia: Secondary | ICD-10-CM | POA: Diagnosis not present

## 2018-07-21 DIAGNOSIS — M25631 Stiffness of right wrist, not elsewhere classified: Secondary | ICD-10-CM | POA: Diagnosis not present

## 2018-07-21 DIAGNOSIS — H5200 Hypermetropia, unspecified eye: Secondary | ICD-10-CM | POA: Diagnosis not present

## 2018-07-21 DIAGNOSIS — H52203 Unspecified astigmatism, bilateral: Secondary | ICD-10-CM | POA: Diagnosis not present

## 2018-07-24 DIAGNOSIS — M25519 Pain in unspecified shoulder: Secondary | ICD-10-CM | POA: Diagnosis not present

## 2018-07-24 DIAGNOSIS — M25631 Stiffness of right wrist, not elsewhere classified: Secondary | ICD-10-CM | POA: Diagnosis not present

## 2018-07-26 DIAGNOSIS — S52531D Colles' fracture of right radius, subsequent encounter for closed fracture with routine healing: Secondary | ICD-10-CM | POA: Diagnosis not present

## 2018-07-26 DIAGNOSIS — M25519 Pain in unspecified shoulder: Secondary | ICD-10-CM | POA: Diagnosis not present

## 2018-07-26 DIAGNOSIS — M542 Cervicalgia: Secondary | ICD-10-CM | POA: Diagnosis not present

## 2018-07-26 MED FILL — ZOLPIDEM TARTRATE 5 MG TAB: 5 | 30 days supply | Qty: 30 | Fill #0

## 2018-07-31 DIAGNOSIS — Z79899 Other long term (current) drug therapy: Secondary | ICD-10-CM | POA: Diagnosis not present

## 2018-07-31 DIAGNOSIS — E7800 Pure hypercholesterolemia, unspecified: Secondary | ICD-10-CM | POA: Diagnosis not present

## 2018-07-31 DIAGNOSIS — Z0000 Encounter for general adult medical examination without abnormal findings: Secondary | ICD-10-CM | POA: Diagnosis not present

## 2018-07-31 DIAGNOSIS — M25511 Pain in right shoulder: Secondary | ICD-10-CM | POA: Diagnosis not present

## 2018-07-31 DIAGNOSIS — R202 Paresthesia of skin: Secondary | ICD-10-CM | POA: Diagnosis not present

## 2018-07-31 DIAGNOSIS — E041 Nontoxic single thyroid nodule: Secondary | ICD-10-CM | POA: Diagnosis not present

## 2018-07-31 DIAGNOSIS — M25631 Stiffness of right wrist, not elsewhere classified: Secondary | ICD-10-CM | POA: Diagnosis not present

## 2018-08-01 DIAGNOSIS — Z79899 Other long term (current) drug therapy: Secondary | ICD-10-CM | POA: Diagnosis not present

## 2018-08-01 DIAGNOSIS — E7800 Pure hypercholesterolemia, unspecified: Secondary | ICD-10-CM | POA: Diagnosis not present

## 2018-08-02 DIAGNOSIS — F321 Major depressive disorder, single episode, moderate: Secondary | ICD-10-CM | POA: Diagnosis not present

## 2018-08-04 DIAGNOSIS — M25631 Stiffness of right wrist, not elsewhere classified: Secondary | ICD-10-CM | POA: Diagnosis not present

## 2018-08-05 DIAGNOSIS — M542 Cervicalgia: Secondary | ICD-10-CM | POA: Diagnosis not present

## 2018-08-07 DIAGNOSIS — G5601 Carpal tunnel syndrome, right upper limb: Secondary | ICD-10-CM | POA: Diagnosis not present

## 2018-08-08 DIAGNOSIS — M25631 Stiffness of right wrist, not elsewhere classified: Secondary | ICD-10-CM | POA: Diagnosis not present

## 2018-08-10 DIAGNOSIS — S134XXA Sprain of ligaments of cervical spine, initial encounter: Secondary | ICD-10-CM | POA: Diagnosis not present

## 2018-08-10 DIAGNOSIS — M542 Cervicalgia: Secondary | ICD-10-CM | POA: Diagnosis not present

## 2018-08-10 DIAGNOSIS — G5601 Carpal tunnel syndrome, right upper limb: Secondary | ICD-10-CM | POA: Diagnosis not present

## 2018-08-15 ENCOUNTER — Other Ambulatory Visit: Payer: Self-pay

## 2018-08-15 ENCOUNTER — Encounter (HOSPITAL_COMMUNITY): Payer: Self-pay | Admitting: *Deleted

## 2018-08-15 NOTE — Progress Notes (Signed)
Spoke with pt for pre-op call. Pt denies cardiac history, HTN, or Diabetes.

## 2018-08-18 ENCOUNTER — Ambulatory Visit (HOSPITAL_COMMUNITY)
Admission: RE | Admit: 2018-08-18 | Discharge: 2018-08-18 | Disposition: A | Discharge status: Home / Self Care | Payer: 59 | Source: Ambulatory Visit | Attending: Orthopedic Surgery | Admitting: Orthopedic Surgery

## 2018-08-18 ENCOUNTER — Encounter (HOSPITAL_COMMUNITY)
Admission: RE | Disposition: A | Discharge status: Home / Self Care | Payer: Self-pay | Source: Ambulatory Visit | Attending: Orthopedic Surgery

## 2018-08-18 ENCOUNTER — Ambulatory Visit (HOSPITAL_COMMUNITY): Payer: 59 | Admitting: Certified Registered Nurse Anesthetist

## 2018-08-18 ENCOUNTER — Encounter (HOSPITAL_COMMUNITY): Payer: Self-pay | Admitting: *Deleted

## 2018-08-18 DIAGNOSIS — Z79899 Other long term (current) drug therapy: Secondary | ICD-10-CM | POA: Diagnosis not present

## 2018-08-18 DIAGNOSIS — F419 Anxiety disorder, unspecified: Secondary | ICD-10-CM | POA: Insufficient documentation

## 2018-08-18 DIAGNOSIS — Z87891 Personal history of nicotine dependence: Secondary | ICD-10-CM | POA: Diagnosis not present

## 2018-08-18 DIAGNOSIS — F329 Major depressive disorder, single episode, unspecified: Secondary | ICD-10-CM | POA: Insufficient documentation

## 2018-08-18 DIAGNOSIS — G5601 Carpal tunnel syndrome, right upper limb: Secondary | ICD-10-CM | POA: Insufficient documentation

## 2018-08-18 HISTORY — PX: CARPAL TUNNEL RELEASE: SHX101

## 2018-08-18 LAB — HEMOGLOBIN: HEMOGLOBIN: 13.4 g/dL (ref 12.0–15.0)

## 2018-08-18 SURGERY — CARPAL TUNNEL RELEASE
Anesthesia: Monitor Anesthesia Care | Site: Hand | Laterality: Right

## 2018-08-18 MED ORDER — CLINDAMYCIN PHOSPHATE 900 MG/50ML IV SOLN
INTRAVENOUS | Status: AC
  Filled 2018-08-18: qty 50

## 2018-08-18 MED ORDER — CLINDAMYCIN PHOSPHATE 900 MG/50ML IV SOLN
900.0000 mg | INTRAVENOUS | Status: AC
  Administered 2018-08-18: 900 mg via INTRAVENOUS

## 2018-08-18 MED ORDER — ONDANSETRON HCL 4 MG/2ML IJ SOLN
INTRAMUSCULAR | Status: DC | PRN
  Administered 2018-08-18: 4 mg via INTRAVENOUS

## 2018-08-18 MED ORDER — LIDOCAINE HCL (CARDIAC) PF 100 MG/5ML IV SOSY
PREFILLED_SYRINGE | INTRAVENOUS | Status: DC | PRN
  Administered 2018-08-18: 40 mg via INTRATRACHEAL

## 2018-08-18 MED ORDER — BUPIVACAINE HCL (PF) 0.25 % IJ SOLN
INTRAMUSCULAR | Status: DC | PRN
  Administered 2018-08-18: 10 mL

## 2018-08-18 MED ORDER — ONDANSETRON HCL 4 MG/2ML IJ SOLN
INTRAMUSCULAR | Status: AC
  Filled 2018-08-18: qty 2

## 2018-08-18 MED ORDER — DIPHENHYDRAMINE HCL 50 MG/ML IJ SOLN
INTRAMUSCULAR | Status: AC
  Filled 2018-08-18: qty 1

## 2018-08-18 MED ORDER — LIDOCAINE 2% (20 MG/ML) 5 ML SYRINGE
INTRAMUSCULAR | Status: AC
  Filled 2018-08-18: qty 5

## 2018-08-18 MED ORDER — FENTANYL CITRATE (PF) 250 MCG/5ML IJ SOLN
INTRAMUSCULAR | Status: DC | PRN
  Administered 2018-08-18 (×2): 50 ug via INTRAVENOUS

## 2018-08-18 MED ORDER — PROPOFOL 500 MG/50ML IV EMUL
INTRAVENOUS | Status: DC | PRN
  Administered 2018-08-18: 20 ug/kg/min via INTRAVENOUS

## 2018-08-18 MED ORDER — 0.9 % SODIUM CHLORIDE (POUR BTL) OPTIME
TOPICAL | Status: DC | PRN
  Administered 2018-08-18: 1000 mL

## 2018-08-18 MED ORDER — DIPHENHYDRAMINE HCL 50 MG/ML IJ SOLN
INTRAMUSCULAR | Status: DC | PRN
  Administered 2018-08-18: 6.25 mg via INTRAVENOUS

## 2018-08-18 MED ORDER — OXYCODONE HCL 5 MG/5ML PO SOLN: 5.0000 mg | Freq: Once | ORAL | Status: AC | PRN

## 2018-08-18 MED ORDER — CHLORHEXIDINE GLUCONATE 4 % EX LIQD: 60.0000 mL | Freq: Once | CUTANEOUS | Status: DC

## 2018-08-18 MED ORDER — MIDAZOLAM HCL 2 MG/2ML IJ SOLN
INTRAMUSCULAR | Status: AC
  Filled 2018-08-18: qty 2

## 2018-08-18 MED ORDER — PROPOFOL 10 MG/ML IV BOLUS
INTRAVENOUS | Status: DC | PRN
  Administered 2018-08-18: 50 mg via INTRAVENOUS
  Administered 2018-08-18: 150 mg via INTRAVENOUS

## 2018-08-18 MED ORDER — OXYCODONE HCL 5 MG PO TABS
5.0000 mg | ORAL_TABLET | Freq: Once | ORAL | Status: AC | PRN
  Administered 2018-08-18: 5 mg via ORAL

## 2018-08-18 MED ORDER — DEXAMETHASONE SODIUM PHOSPHATE 10 MG/ML IJ SOLN
INTRAMUSCULAR | Status: DC | PRN
  Administered 2018-08-18: 10 mg via INTRAVENOUS

## 2018-08-18 MED ORDER — DEXAMETHASONE SODIUM PHOSPHATE 10 MG/ML IJ SOLN
INTRAMUSCULAR | Status: AC
  Filled 2018-08-18: qty 1

## 2018-08-18 MED ORDER — PHENYLEPHRINE HCL 10 MG/ML IJ SOLN
INTRAMUSCULAR | Status: DC | PRN
  Administered 2018-08-18: 80 ug via INTRAVENOUS

## 2018-08-18 MED ORDER — LACTATED RINGERS IV SOLN
INTRAVENOUS | Status: DC
  Administered 2018-08-18: 10:00:00 via INTRAVENOUS

## 2018-08-18 MED ORDER — MIDAZOLAM HCL 2 MG/2ML IJ SOLN
INTRAMUSCULAR | Status: DC | PRN
  Administered 2018-08-18: 2 mg via INTRAVENOUS

## 2018-08-18 MED ORDER — OXYCODONE HCL 5 MG PO TABS
ORAL_TABLET | ORAL | Status: AC
  Administered 2018-08-18: 5 mg via ORAL
  Filled 2018-08-18: qty 1

## 2018-08-18 MED ORDER — FENTANYL CITRATE (PF) 250 MCG/5ML IJ SOLN
INTRAMUSCULAR | Status: AC
  Filled 2018-08-18: qty 5

## 2018-08-18 MED ORDER — BUPIVACAINE HCL (PF) 0.25 % IJ SOLN
INTRAMUSCULAR | Status: AC
  Filled 2018-08-18: qty 30

## 2018-08-18 MED ORDER — PROMETHAZINE HCL 25 MG/ML IJ SOLN: 6.2500 mg | INTRAMUSCULAR | Status: DC | PRN

## 2018-08-18 MED ORDER — FENTANYL CITRATE (PF) 100 MCG/2ML IJ SOLN: 25.0000 ug | INTRAMUSCULAR | Status: DC | PRN

## 2018-08-18 MED FILL — traMADol HCL 50 MG TABS: 50 | 8 days supply | Qty: 50 | Fill #0

## 2018-08-18 MED FILL — HYDROCODON-APAP 5-325: 5-325 | 6 days supply | Qty: 35 | Fill #0

## 2018-08-18 MED FILL — GABAPENTIN 100 MG CAPS: 100 | 30 days supply | Qty: 90 | Fill #1

## 2018-08-18 MED FILL — METHOCARBAMOL 500 MG TABS: 500 | 10 days supply | Qty: 40 | Fill #0

## 2018-08-18 SURGICAL SUPPLY — 38 items
BANDAGE ACE 3X5.8 VEL STRL LF (GAUZE/BANDAGES/DRESSINGS) ×4 IMPLANT
BANDAGE ACE 4X5 VEL STRL LF (GAUZE/BANDAGES/DRESSINGS) ×2 IMPLANT
BNDG GAUZE ELAST 4 BULKY (GAUZE/BANDAGES/DRESSINGS) ×4 IMPLANT
CORDS BIPOLAR (ELECTRODE) ×2 IMPLANT
COVER SURGICAL LIGHT HANDLE (MISCELLANEOUS) ×2 IMPLANT
COVER WAND RF STERILE (DRAPES) ×2 IMPLANT
CUFF TOURNIQUET SINGLE 18IN (TOURNIQUET CUFF) ×2 IMPLANT
DRAPE SURG 17X23 STRL (DRAPES) ×2 IMPLANT
DRSG EMULSION OIL 3X3 NADH (GAUZE/BANDAGES/DRESSINGS) ×1 IMPLANT
GAUZE SPONGE 4X4 12PLY STRL (GAUZE/BANDAGES/DRESSINGS) ×2 IMPLANT
GAUZE XEROFORM 1X8 LF (GAUZE/BANDAGES/DRESSINGS) ×2 IMPLANT
GLOVE BIOGEL M 8.0 STRL (GLOVE) ×2 IMPLANT
GLOVE SS BIOGEL STRL SZ 8 (GLOVE) ×1 IMPLANT
GLOVE SUPERSENSE BIOGEL SZ 8 (GLOVE) ×1
GOWN STRL REUS W/ TWL LRG LVL3 (GOWN DISPOSABLE) ×2 IMPLANT
GOWN STRL REUS W/ TWL XL LVL3 (GOWN DISPOSABLE) ×3 IMPLANT
GOWN STRL REUS W/TWL LRG LVL3 (GOWN DISPOSABLE) ×2
GOWN STRL REUS W/TWL XL LVL3 (GOWN DISPOSABLE) ×2
KIT BASIN OR (CUSTOM PROCEDURE TRAY) ×2 IMPLANT
KIT TURNOVER KIT B (KITS) ×2 IMPLANT
NDL HYPO 25GX1X1/2 BEV (NEEDLE) IMPLANT
NEEDLE HYPO 25GX1X1/2 BEV (NEEDLE) ×2 IMPLANT
NS IRRIG 1000ML POUR BTL (IV SOLUTION) ×2 IMPLANT
PACK ORTHO EXTREMITY (CUSTOM PROCEDURE TRAY) ×2 IMPLANT
PAD ARMBOARD 7.5X6 YLW CONV (MISCELLANEOUS) ×4 IMPLANT
PAD CAST 4YDX4 CTTN HI CHSV (CAST SUPPLIES) ×2 IMPLANT
PADDING CAST COTTON 4X4 STRL (CAST SUPPLIES) ×4
SCRUB BETADINE 4OZ XXX (MISCELLANEOUS) ×2 IMPLANT
SOL PREP POV-IOD 4OZ 10% (MISCELLANEOUS) ×4 IMPLANT
SUT PROLENE 4 0 PS 2 18 (SUTURE) ×2 IMPLANT
SUT VIC AB 2-0 CT1 27 (SUTURE)
SUT VIC AB 2-0 CT1 TAPERPNT 27 (SUTURE) IMPLANT
SUT VIC AB 3-0 FS2 27 (SUTURE) IMPLANT
SYR CONTROL 10ML LL (SYRINGE) ×1 IMPLANT
TOWEL OR 17X26 10 PK STRL BLUE (TOWEL DISPOSABLE) ×2 IMPLANT
TUBE CONNECTING 12X1/4 (SUCTIONS) IMPLANT
TUBE EVACUATION TLS (MISCELLANEOUS) ×2 IMPLANT
UNDERPAD 30X30 (UNDERPADS AND DIAPERS) ×2 IMPLANT

## 2018-08-18 NOTE — Transfer of Care (Signed)
Immediate Anesthesia Transfer of Care Note  Patient: Dana Sanders  Procedure(s) Performed: Right limited open carpal tunnel release (Right Hand)  Patient Location: PACU  Anesthesia Type:General  Level of Consciousness: drowsy and patient cooperative  Airway & Oxygen Therapy: Patient Spontanous Breathing and Patient connected to face mask oxygen  Post-op Assessment: Report given to RN and Post -op Vital signs reviewed and stable  Post vital signs: Reviewed and stable  Last Vitals:  Vitals Value Taken Time  BP    Temp    Pulse 78 08/18/2018  1:34 PM  Resp 15 08/18/2018  1:34 PM  SpO2 100 % 08/18/2018  1:34 PM  Vitals shown include unvalidated device data.  Last Pain:  Vitals:   08/18/18 0937  TempSrc:   PainSc: 6       Patients Stated Pain Goal: 4 (77/41/42 3953)  Complications: No apparent anesthesia complications

## 2018-08-18 NOTE — Anesthesia Preprocedure Evaluation (Addendum)
Anesthesia Evaluation  Patient identified by MRN, date of birth, ID band Patient awake    Reviewed: Allergy & Precautions, NPO status , Patient's Chart, lab work & pertinent test results  History of Anesthesia Complications (+) PONV and history of anesthetic complications  Airway Mallampati: II  TM Distance: >3 FB Neck ROM: Full  Mouth opening: Limited Mouth Opening  Dental  (+) Dental Advisory Given, Teeth Intact   Pulmonary former smoker,    breath sounds clear to auscultation       Cardiovascular negative cardio ROS   Rhythm:Regular Rate:Normal     Neuro/Psych  Headaches, PSYCHIATRIC DISORDERS Anxiety Depression    GI/Hepatic negative GI ROS, Neg liver ROS,   Endo/Other  negative endocrine ROS  Renal/GU  Hx kidney stones      Musculoskeletal negative musculoskeletal ROS (+)   Abdominal   Peds  Hematology negative hematology ROS (+)   Anesthesia Other Findings Issues with jaw locking in past   Reproductive/Obstetrics  S/p hysterectomy                             Anesthesia Physical Anesthesia Plan  ASA: II  Anesthesia Plan: General   Post-op Pain Management:    Induction: Intravenous  PONV Risk Score and Plan: 4 or greater and Treatment may vary due to age or medical condition, Ondansetron, Propofol infusion and Midazolam  Airway Management Planned: LMA  Additional Equipment: None  Intra-op Plan:   Post-operative Plan: Extubation in OR  Informed Consent: I have reviewed the patients History and Physical, chart, labs and discussed the procedure including the risks, benefits and alternatives for the proposed anesthesia with the patient or authorized representative who has indicated his/her understanding and acceptance.   Dental advisory given  Plan Discussed with: CRNA and Anesthesiologist  Anesthesia Plan Comments:        Anesthesia Quick Evaluation

## 2018-08-18 NOTE — Anesthesia Procedure Notes (Signed)
Procedure Name: LMA Insertion Date/Time: 08/18/2018 12:39 PM Performed by: Kathryne Hitch, CRNA Pre-anesthesia Checklist: Patient identified, Emergency Drugs available, Suction available, Patient being monitored and Timeout performed Patient Re-evaluated:Patient Re-evaluated prior to induction Oxygen Delivery Method: Circle system utilized Preoxygenation: Pre-oxygenation with 100% oxygen Induction Type: IV induction Ventilation: Mask ventilation without difficulty LMA: LMA inserted LMA Size: 4.0 Number of attempts: 1 Placement Confirmation: breath sounds checked- equal and bilateral and positive ETCO2 Tube secured with: Tape Dental Injury: Teeth and Oropharynx as per pre-operative assessment

## 2018-08-18 NOTE — Discharge Instructions (Signed)

## 2018-08-18 NOTE — Anesthesia Postprocedure Evaluation (Signed)
Anesthesia Post Note  Patient: Dana Sanders  Procedure(s) Performed: Right limited open carpal tunnel release (Right Hand)     Patient location during evaluation: PACU Anesthesia Type: MAC Level of consciousness: awake and alert Pain management: pain level controlled Vital Signs Assessment: post-procedure vital signs reviewed and stable Respiratory status: spontaneous breathing, nonlabored ventilation, respiratory function stable and patient connected to nasal cannula oxygen Cardiovascular status: blood pressure returned to baseline and stable Postop Assessment: no apparent nausea or vomiting Anesthetic complications: no    Last Vitals:  Vitals:   08/18/18 1345 08/18/18 1400  BP: 93/76 105/68  Pulse: 66 75  Resp: 11 14  Temp:  36.5 C  SpO2: 97% 96%    Last Pain:  Vitals:   08/18/18 1400  TempSrc:   PainSc: 0-No pain                 Anjalee Cope COKER

## 2018-08-18 NOTE — Op Note (Signed)
See dictation#[id number] Rt CTR Muhammed Teutsch MD
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
## 2018-08-18 NOTE — H&P (Signed)
Dana Sanders is an 57 y.o. female.   Chief Complaint: right CTS HPI: Patient presents for evaluation and treatment of the of their upper extremity predicament. The patient denies neck, back, chest or  abdominal pain. The patient notes that they have no lower extremity problems. The patients primary complaint is noted. We are planning surgical care pathway for the upper extremity.  Plan right CTR  Past Medical History:  Diagnosis Date  . Anxiety   . Depression   . Headache    migraines in the past  . History of kidney stones   . Lichen sclerosus   . Low iron   . Pneumonia   . PONV (postoperative nausea and vomiting)     Past Surgical History:  Procedure Laterality Date  . AUGMENTATION MAMMAPLASTY Bilateral 2012  . BREAST CYST ASPIRATION  12/12/2012  . BREAST SURGERY     rt bx  . CYSTOSCOPY W/ RETROGRADES Left 07/24/2014   Procedure: cYSTOSCOPY WITH RETROGRADE PYELOGRAM dilation left ureter basket extraction left ureteral stone left double j stent;  Surgeon: Ailene Rud, MD;  Location: [facility];  Service: Urology;  Laterality: Left;  . CYSTOSCOPY WITH STENT PLACEMENT Left 07/24/2014   Procedure: CYSTOSCOPY WITH STENT PLACEMENT;  Surgeon: Ailene Rud, MD;  Location: [facility];  Service: Urology;  Laterality: Left;  . FINGER ARTHRODESIS Right 02/06/2013   Procedure: ARTHRODESIS FINGER;  Surgeon: Cammie Sickle., MD;  Location: [place];  Service: Orthopedics;  Laterality: Right;  Right Index Metacarpal phalangeal jOint Arthrodesis  . OPEN REDUCTION INTERNAL FIXATION (ORIF) DISTAL RADIAL FRACTURE Right 04/12/2018   Procedure: OPEN REDUCTION INTERNAL FIXATION (ORIF) DISTAL RADIAL FRACTURE;  Surgeon: Roseanne Kaufman, MD;  Location: [place];  Service: Orthopedics;  Laterality: Right;  . septorhinoplasty  1988  . SHOULDER ARTHROSCOPY WITH ROTATOR CUFF REPAIR Right 06/08/2018   Procedure: SHOULDER ARTHROSCOPY WITH ROTATOR CUFF REPAIR [place] AND  SUBCAPSULARIS REPAIR;  Surgeon: Tania Ade, MD;  Location: [place];  Service: Orthopedics;  Laterality: Right;  . SHOULDER SURGERY     bilateral x5  . TOTAL ABDOMINAL HYSTERECTOMY  1999    Family History  Problem Relation Age of Onset  . Heart disease Father        pacemaker  . Leukemia Daughter   . Allergies Son   . Rheum arthritis Sister    Social History:  reports that she quit smoking about 12 years ago. Her smoking use included cigarettes. She has a 3.00 pack-year smoking history. She has never used smokeless tobacco. She reports that she drinks alcohol. She reports that she does not use drugs.  Allergies:  Allergies  Allergen Reactions  . Other Anaphylaxis    FOOD ALLERGY: Anaphylactic reaction to MELONS  . Erythromycin Hives  . Penicillins Hives    Has patient had a PCN reaction causing immediate rash, facial/tongue/throat swelling, SOB or lightheadedness with hypotension: Yes Has patient had a PCN reaction causing severe rash involving mucus membranes or skin necrosis: No Has patient had a PCN reaction that required hospitalization: No Has patient had a PCN reaction occurring within the last 10 years: No If all of the above answers are "NO", then may proceed with Cephalosporin use.     Medications Prior to Admission  Medication Sig Dispense Refill  . gabapentin (NEURONTIN) 100 MG capsule Take 200 mg by mouth 3 (three) times daily.  5  . simvastatin (ZOCOR) 10 MG tablet Take 10 mg by mouth daily.    Marland Kitchen  traMADol (ULTRAM) 50 MG tablet Take 50 mg by mouth every 6 (six) hours as needed (for pain.).     Marland Kitchen methocarbamol (ROBAXIN) 500 MG tablet Take 1 tablet (500 mg total) by mouth 2 (two) times daily. (Patient not taking: Reported on 08/11/2018) 20 tablet 0  . oxyCODONE-acetaminophen (PERCOCET) 5-325 MG tablet Take 1-2 tablets by mouth every 4 (four) hours as needed for severe pain. (Patient not taking: Reported on 08/11/2018) 40 tablet 0    Results for  orders placed or performed during the hospital encounter of 08/18/18 (from the past 48 hour(s))  Hemoglobin     Status: None   Collection Time: 08/18/18  9:56 AM  Result Value Ref Range   Hemoglobin 13.4 12.0 - 15.0 g/dL    Comment: Performed at [facility], [place] [address], [place], [place] [address]   No results found.  ROS  Blood pressure 112/80, pulse 67, temperature 97.9 F (36.6 C), temperature source Oral, resp. rate 18, height 5\' 1"  (1.549 m), weight 59 kg, SpO2 99 %. Physical Exam  Please see office notes and surgical history Positive NCV and exam c/w CTS The patient is alert and oriented in no acute distress. The patient complains of pain in the affected upper extremity.  The patient is noted to have a normal HEENT exam. Lung fields show equal chest expansion and no shortness of breath. Abdomen exam is nontender without distention. Lower extremity examination does not show any fracture dislocation or blood clot symptoms. Pelvis is stable and the neck and back are stable and nontender. Assessment/Plan Plan right CTR We are planning surgery for your upper extremity. The risk and benefits of surgery to include risk of bleeding, infection, anesthesia,  damage to normal structures and failure of the surgery to accomplish its intended goals of relieving symptoms and restoring function have been discussed in detail. With this in mind we plan to proceed. I have specifically discussed with the patient the pre-and postoperative regime and the dos and don'ts and risk and benefits in great detail. Risk and benefits of surgery also include risk of dystrophy(CRPS), chronic nerve pain, failure of the healing process to go onto completion and other inherent risks of surgery The relavent the pathophysiology of the disease/injury process, as well as the alternatives for treatment and postoperative course of action has been discussed in great detail with the patient who desires to  proceed.  We will do everything in our power to help you (the patient) restore function to the upper extremity. It is a pleasure to see this patient today.   Willa Frater III, MD 08/18/2018, 10:36 AM

## 2018-08-19 ENCOUNTER — Encounter (HOSPITAL_COMMUNITY): Payer: Self-pay | Admitting: Orthopedic Surgery

## 2018-08-28 DIAGNOSIS — Z9889 Other specified postprocedural states: Secondary | ICD-10-CM | POA: Diagnosis not present

## 2018-08-28 DIAGNOSIS — F321 Major depressive disorder, single episode, moderate: Secondary | ICD-10-CM | POA: Diagnosis not present

## 2018-09-01 DIAGNOSIS — M25631 Stiffness of right wrist, not elsewhere classified: Secondary | ICD-10-CM | POA: Diagnosis not present

## 2018-09-04 DIAGNOSIS — M25631 Stiffness of right wrist, not elsewhere classified: Secondary | ICD-10-CM | POA: Diagnosis not present

## 2018-09-07 DIAGNOSIS — M25631 Stiffness of right wrist, not elsewhere classified: Secondary | ICD-10-CM | POA: Diagnosis not present

## 2018-09-14 DIAGNOSIS — M25631 Stiffness of right wrist, not elsewhere classified: Secondary | ICD-10-CM | POA: Diagnosis not present

## 2018-09-18 DIAGNOSIS — M25631 Stiffness of right wrist, not elsewhere classified: Secondary | ICD-10-CM | POA: Diagnosis not present

## 2018-09-20 DIAGNOSIS — F321 Major depressive disorder, single episode, moderate: Secondary | ICD-10-CM | POA: Diagnosis not present

## 2018-09-21 DIAGNOSIS — Z4789 Encounter for other orthopedic aftercare: Secondary | ICD-10-CM | POA: Diagnosis not present

## 2018-09-21 DIAGNOSIS — M25521 Pain in right elbow: Secondary | ICD-10-CM | POA: Diagnosis not present

## 2018-09-21 DIAGNOSIS — M25531 Pain in right wrist: Secondary | ICD-10-CM | POA: Diagnosis not present

## 2018-09-21 DIAGNOSIS — G5601 Carpal tunnel syndrome, right upper limb: Secondary | ICD-10-CM | POA: Diagnosis not present

## 2018-09-27 DIAGNOSIS — M67911 Unspecified disorder of synovium and tendon, right shoulder: Secondary | ICD-10-CM | POA: Diagnosis not present

## 2018-09-28 DIAGNOSIS — M25612 Stiffness of left shoulder, not elsewhere classified: Secondary | ICD-10-CM | POA: Diagnosis not present

## 2018-09-28 DIAGNOSIS — M6281 Muscle weakness (generalized): Secondary | ICD-10-CM | POA: Diagnosis not present

## 2018-10-03 DIAGNOSIS — M6281 Muscle weakness (generalized): Secondary | ICD-10-CM | POA: Diagnosis not present

## 2018-10-03 DIAGNOSIS — M25612 Stiffness of left shoulder, not elsewhere classified: Secondary | ICD-10-CM | POA: Diagnosis not present

## 2018-10-03 MED FILL — SIMVASTATIN 10 MG TABLET: 10 | 90 days supply | Qty: 90 | Fill #3

## 2018-10-11 DIAGNOSIS — M6281 Muscle weakness (generalized): Secondary | ICD-10-CM | POA: Diagnosis not present

## 2018-10-11 DIAGNOSIS — M25611 Stiffness of right shoulder, not elsewhere classified: Secondary | ICD-10-CM | POA: Diagnosis not present

## 2018-10-11 DIAGNOSIS — M25612 Stiffness of left shoulder, not elsewhere classified: Secondary | ICD-10-CM | POA: Diagnosis not present

## 2018-10-11 MED FILL — traMADol HCL 50 MG TABS: 50 | 15 days supply | Qty: 30 | Fill #0

## 2018-10-11 MED FILL — GABAPENTIN 400 MG CAPSULE: 400 | 30 days supply | Qty: 30 | Fill #0

## 2018-10-18 DIAGNOSIS — M6281 Muscle weakness (generalized): Secondary | ICD-10-CM | POA: Diagnosis not present

## 2018-10-18 DIAGNOSIS — M25612 Stiffness of left shoulder, not elsewhere classified: Secondary | ICD-10-CM | POA: Diagnosis not present

## 2018-11-10 DIAGNOSIS — M25511 Pain in right shoulder: Secondary | ICD-10-CM | POA: Diagnosis not present

## 2018-11-15 DIAGNOSIS — G5601 Carpal tunnel syndrome, right upper limb: Secondary | ICD-10-CM | POA: Diagnosis not present

## 2018-11-15 DIAGNOSIS — S134XXD Sprain of ligaments of cervical spine, subsequent encounter: Secondary | ICD-10-CM | POA: Diagnosis not present

## 2018-11-15 DIAGNOSIS — M542 Cervicalgia: Secondary | ICD-10-CM | POA: Diagnosis not present

## 2018-11-15 DIAGNOSIS — Z4789 Encounter for other orthopedic aftercare: Secondary | ICD-10-CM | POA: Diagnosis not present

## 2018-11-15 DIAGNOSIS — M25531 Pain in right wrist: Secondary | ICD-10-CM | POA: Diagnosis not present

## 2018-11-15 DIAGNOSIS — M25519 Pain in unspecified shoulder: Secondary | ICD-10-CM | POA: Diagnosis not present

## 2018-11-16 DIAGNOSIS — F321 Major depressive disorder, single episode, moderate: Secondary | ICD-10-CM | POA: Diagnosis not present

## 2018-11-17 MED FILL — GABAPENTIN 400 MG CAPSULE: 400 | 30 days supply | Qty: 30 | Fill #0 | Status: TO

## 2018-11-20 DIAGNOSIS — H52203 Unspecified astigmatism, bilateral: Secondary | ICD-10-CM | POA: Diagnosis not present

## 2018-11-20 DIAGNOSIS — H2513 Age-related nuclear cataract, bilateral: Secondary | ICD-10-CM | POA: Diagnosis not present

## 2018-11-20 DIAGNOSIS — H524 Presbyopia: Secondary | ICD-10-CM | POA: Diagnosis not present

## 2018-11-20 DIAGNOSIS — H5203 Hypermetropia, bilateral: Secondary | ICD-10-CM | POA: Diagnosis not present

## 2018-11-29 DIAGNOSIS — M79601 Pain in right arm: Secondary | ICD-10-CM | POA: Diagnosis not present

## 2018-11-29 DIAGNOSIS — M47812 Spondylosis without myelopathy or radiculopathy, cervical region: Secondary | ICD-10-CM | POA: Diagnosis not present

## 2018-11-29 DIAGNOSIS — M60821 Other myositis, right upper arm: Secondary | ICD-10-CM | POA: Diagnosis not present

## 2018-11-29 DIAGNOSIS — M792 Neuralgia and neuritis, unspecified: Secondary | ICD-10-CM | POA: Diagnosis not present

## 2018-11-29 DIAGNOSIS — M5481 Occipital neuralgia: Secondary | ICD-10-CM | POA: Diagnosis not present

## 2018-11-29 DIAGNOSIS — S060X0A Concussion without loss of consciousness, initial encounter: Secondary | ICD-10-CM | POA: Diagnosis not present

## 2018-11-29 DIAGNOSIS — M542 Cervicalgia: Secondary | ICD-10-CM | POA: Diagnosis not present

## 2018-11-29 DIAGNOSIS — M60811 Other myositis, right shoulder: Secondary | ICD-10-CM | POA: Diagnosis not present

## 2018-11-29 DIAGNOSIS — F0781 Postconcussional syndrome: Secondary | ICD-10-CM | POA: Diagnosis not present

## 2018-11-29 MED FILL — INDOMETHACIN 25 MG CAPSULE: 25 | 30 days supply | Qty: 90 | Fill #0

## 2018-11-30 MED FILL — NORTRIPTYLINE HCL 10 MG CAP: 10 | 30 days supply | Qty: 30 | Fill #0

## 2018-12-08 MED FILL — traMADol HCL 50 MG TABS: 50 | 15 days supply | Qty: 30 | Fill #0

## 2018-12-13 DIAGNOSIS — F321 Major depressive disorder, single episode, moderate: Secondary | ICD-10-CM | POA: Diagnosis not present

## 2018-12-14 DIAGNOSIS — M5441 Lumbago with sciatica, right side: Secondary | ICD-10-CM | POA: Diagnosis not present

## 2018-12-14 DIAGNOSIS — R202 Paresthesia of skin: Secondary | ICD-10-CM | POA: Diagnosis not present

## 2018-12-19 MED FILL — GABAPENTIN 400 MG CAPS: 400 | 30 days supply | Qty: 30 | Fill #0

## 2018-12-22 DIAGNOSIS — M25511 Pain in right shoulder: Secondary | ICD-10-CM | POA: Diagnosis not present

## 2019-01-03 MED FILL — traMADol HCL 50 MG TABS: 50 | 14 days supply | Qty: 42 | Fill #0

## 2019-01-10 DIAGNOSIS — F321 Major depressive disorder, single episode, moderate: Secondary | ICD-10-CM | POA: Diagnosis not present

## 2019-01-31 MED FILL — GABAPENTIN 400 MG CAPS: 400 | 30 days supply | Qty: 30 | Fill #1 | Status: TO

## 2019-01-31 MED FILL — SIMVASTATIN 10 MG TABLET: 10 | 90 days supply | Qty: 90 | Fill #0

## 2019-01-31 MED FILL — traMADol HCL 50 MG TABS: 50 | 14 days supply | Qty: 42 | Fill #0

## 2019-02-01 DIAGNOSIS — F321 Major depressive disorder, single episode, moderate: Secondary | ICD-10-CM | POA: Diagnosis not present

## 2019-02-07 DIAGNOSIS — S52501D Unspecified fracture of the lower end of right radius, subsequent encounter for closed fracture with routine healing: Secondary | ICD-10-CM | POA: Diagnosis not present

## 2019-02-07 DIAGNOSIS — M25531 Pain in right wrist: Secondary | ICD-10-CM | POA: Diagnosis not present

## 2019-02-07 DIAGNOSIS — G5601 Carpal tunnel syndrome, right upper limb: Secondary | ICD-10-CM | POA: Diagnosis not present

## 2019-02-07 DIAGNOSIS — M25521 Pain in right elbow: Secondary | ICD-10-CM | POA: Diagnosis not present

## 2019-02-07 DIAGNOSIS — Z4789 Encounter for other orthopedic aftercare: Secondary | ICD-10-CM | POA: Diagnosis not present

## 2019-02-20 ENCOUNTER — Other Ambulatory Visit: Payer: Self-pay | Admitting: Geriatric Medicine

## 2019-02-20 DIAGNOSIS — Z1231 Encounter for screening mammogram for malignant neoplasm of breast: Secondary | ICD-10-CM

## 2019-02-27 DIAGNOSIS — F321 Major depressive disorder, single episode, moderate: Secondary | ICD-10-CM | POA: Diagnosis not present

## 2019-02-27 MED FILL — GABAPENTIN 400 MG CAPSULE: 400 | 30 days supply | Qty: 30 | Fill #0

## 2019-02-28 DIAGNOSIS — G5601 Carpal tunnel syndrome, right upper limb: Secondary | ICD-10-CM | POA: Diagnosis not present

## 2019-02-28 DIAGNOSIS — M25531 Pain in right wrist: Secondary | ICD-10-CM | POA: Diagnosis not present

## 2019-02-28 DIAGNOSIS — M25521 Pain in right elbow: Secondary | ICD-10-CM | POA: Diagnosis not present

## 2019-03-12 DIAGNOSIS — M25531 Pain in right wrist: Secondary | ICD-10-CM | POA: Diagnosis not present

## 2019-03-19 MED FILL — GABAPENTIN 400 MG CAPSULE: 400 | 30 days supply | Qty: 60 | Fill #0

## 2019-03-30 DIAGNOSIS — M79609 Pain in unspecified limb: Secondary | ICD-10-CM | POA: Diagnosis not present

## 2019-04-10 ENCOUNTER — Ambulatory Visit: Payer: 59

## 2019-04-12 DIAGNOSIS — F321 Major depressive disorder, single episode, moderate: Secondary | ICD-10-CM | POA: Diagnosis not present

## 2019-04-13 MED FILL — GABAPENTIN 400 MG CAPSULE: 400 | 30 days supply | Qty: 90 | Fill #0

## 2019-04-17 DIAGNOSIS — G4700 Insomnia, unspecified: Secondary | ICD-10-CM | POA: Diagnosis not present

## 2019-04-17 DIAGNOSIS — R05 Cough: Secondary | ICD-10-CM | POA: Diagnosis not present

## 2019-04-17 DIAGNOSIS — M79601 Pain in right arm: Secondary | ICD-10-CM | POA: Diagnosis not present

## 2019-04-17 DIAGNOSIS — E7800 Pure hypercholesterolemia, unspecified: Secondary | ICD-10-CM | POA: Diagnosis not present

## 2019-04-17 DIAGNOSIS — Z1159 Encounter for screening for other viral diseases: Secondary | ICD-10-CM | POA: Diagnosis not present

## 2019-05-01 DIAGNOSIS — R531 Weakness: Secondary | ICD-10-CM | POA: Diagnosis not present

## 2019-05-01 DIAGNOSIS — R202 Paresthesia of skin: Secondary | ICD-10-CM | POA: Diagnosis not present

## 2019-05-01 DIAGNOSIS — R208 Other disturbances of skin sensation: Secondary | ICD-10-CM | POA: Diagnosis not present

## 2019-05-01 DIAGNOSIS — M792 Neuralgia and neuritis, unspecified: Secondary | ICD-10-CM | POA: Diagnosis not present

## 2019-05-18 ENCOUNTER — Ambulatory Visit
Admission: RE | Admit: 2019-05-18 | Discharge: 2019-05-18 | Disposition: A | Discharge status: Home / Self Care | Payer: 59 | Source: Ambulatory Visit | Attending: Geriatric Medicine | Admitting: Geriatric Medicine

## 2019-05-18 ENCOUNTER — Other Ambulatory Visit: Payer: Self-pay

## 2019-05-18 DIAGNOSIS — Z1231 Encounter for screening mammogram for malignant neoplasm of breast: Secondary | ICD-10-CM

## 2019-05-23 DIAGNOSIS — S46811A Strain of other muscles, fascia and tendons at shoulder and upper arm level, right arm, initial encounter: Secondary | ICD-10-CM | POA: Diagnosis not present

## 2019-05-23 DIAGNOSIS — M25411 Effusion, right shoulder: Secondary | ICD-10-CM | POA: Diagnosis not present

## 2019-05-30 MED FILL — SIMVASTATIN 10 MG TABLET: 10 | 90 days supply | Qty: 90 | Fill #0

## 2019-06-05 DIAGNOSIS — M7551 Bursitis of right shoulder: Secondary | ICD-10-CM | POA: Diagnosis not present

## 2019-06-05 DIAGNOSIS — S143XXA Injury of brachial plexus, initial encounter: Secondary | ICD-10-CM | POA: Diagnosis not present

## 2019-06-05 DIAGNOSIS — M75111 Incomplete rotator cuff tear or rupture of right shoulder, not specified as traumatic: Secondary | ICD-10-CM | POA: Diagnosis not present

## 2019-06-13 DIAGNOSIS — M7551 Bursitis of right shoulder: Secondary | ICD-10-CM | POA: Diagnosis not present

## 2019-06-13 DIAGNOSIS — M75101 Unspecified rotator cuff tear or rupture of right shoulder, not specified as traumatic: Secondary | ICD-10-CM | POA: Diagnosis not present

## 2019-06-13 DIAGNOSIS — S143XXS Injury of brachial plexus, sequela: Secondary | ICD-10-CM | POA: Diagnosis not present

## 2019-07-02 DIAGNOSIS — F321 Major depressive disorder, single episode, moderate: Secondary | ICD-10-CM | POA: Diagnosis not present

## 2019-07-06 DIAGNOSIS — R531 Weakness: Secondary | ICD-10-CM | POA: Diagnosis not present

## 2019-07-06 DIAGNOSIS — R413 Other amnesia: Secondary | ICD-10-CM | POA: Diagnosis not present

## 2019-07-06 DIAGNOSIS — M79641 Pain in right hand: Secondary | ICD-10-CM | POA: Diagnosis not present

## 2019-07-06 DIAGNOSIS — R201 Hypoesthesia of skin: Secondary | ICD-10-CM | POA: Diagnosis not present

## 2019-07-06 DIAGNOSIS — Z79899 Other long term (current) drug therapy: Secondary | ICD-10-CM | POA: Diagnosis not present

## 2019-07-09 ENCOUNTER — Ambulatory Visit: Payer: 59 | Admitting: Psychiatry

## 2019-07-26 DIAGNOSIS — Z029 Encounter for administrative examinations, unspecified: Secondary | ICD-10-CM | POA: Diagnosis not present

## 2019-07-26 DIAGNOSIS — Z560 Unemployment, unspecified: Secondary | ICD-10-CM | POA: Diagnosis not present

## 2019-08-06 DIAGNOSIS — Z23 Encounter for immunization: Secondary | ICD-10-CM | POA: Diagnosis not present

## 2019-08-06 DIAGNOSIS — G4701 Insomnia due to medical condition: Secondary | ICD-10-CM | POA: Diagnosis not present

## 2019-08-06 DIAGNOSIS — G540 Brachial plexus disorders: Secondary | ICD-10-CM | POA: Diagnosis not present

## 2019-08-06 DIAGNOSIS — Z79899 Other long term (current) drug therapy: Secondary | ICD-10-CM | POA: Diagnosis not present

## 2019-08-06 DIAGNOSIS — E7800 Pure hypercholesterolemia, unspecified: Secondary | ICD-10-CM | POA: Diagnosis not present

## 2019-08-06 DIAGNOSIS — Z0000 Encounter for general adult medical examination without abnormal findings: Secondary | ICD-10-CM | POA: Diagnosis not present

## 2019-08-21 DIAGNOSIS — S143XXS Injury of brachial plexus, sequela: Secondary | ICD-10-CM | POA: Diagnosis not present

## 2019-08-28 DIAGNOSIS — S143XXA Injury of brachial plexus, initial encounter: Secondary | ICD-10-CM | POA: Diagnosis not present

## 2019-08-28 DIAGNOSIS — Z79899 Other long term (current) drug therapy: Secondary | ICD-10-CM | POA: Diagnosis not present

## 2019-08-28 DIAGNOSIS — E7800 Pure hypercholesterolemia, unspecified: Secondary | ICD-10-CM | POA: Diagnosis not present

## 2019-08-28 DIAGNOSIS — G43909 Migraine, unspecified, not intractable, without status migrainosus: Secondary | ICD-10-CM | POA: Diagnosis not present

## 2019-08-30 DIAGNOSIS — F321 Major depressive disorder, single episode, moderate: Secondary | ICD-10-CM | POA: Diagnosis not present

## 2019-09-05 MED FILL — traMADol HCL 50 MG TABS: 50 | 20 days supply | Qty: 60 | Fill #0

## 2019-09-07 DIAGNOSIS — G43909 Migraine, unspecified, not intractable, without status migrainosus: Secondary | ICD-10-CM | POA: Diagnosis not present

## 2019-09-10 DIAGNOSIS — S143XXS Injury of brachial plexus, sequela: Secondary | ICD-10-CM | POA: Diagnosis not present

## 2019-09-18 DIAGNOSIS — F321 Major depressive disorder, single episode, moderate: Secondary | ICD-10-CM | POA: Diagnosis not present

## 2019-09-18 DIAGNOSIS — S143XXS Injury of brachial plexus, sequela: Secondary | ICD-10-CM | POA: Diagnosis not present

## 2019-09-20 DIAGNOSIS — S143XXS Injury of brachial plexus, sequela: Secondary | ICD-10-CM | POA: Diagnosis not present

## 2019-09-25 MED FILL — GABAPENTIN 300 MG CAPSULE: 300 | 30 days supply | Qty: 30 | Fill #0

## 2019-12-28 IMAGING — MG MM  DIGITAL SCREENING BREAST BILAT IMPLANT W/ TOMO W/ CAD
9 of 12 series · 9 of 28 positions shown · non-contrast
Comparison: Previous exam(s).

CLINICAL DATA: Screening.

EXAM:
DIGITAL SCREENING BILATERAL MAMMOGRAM WITH IMPLANTS, CAD AND TOMO
The patient has retroglandular implants. Standard and implant
displaced views were performed.

[L CC]
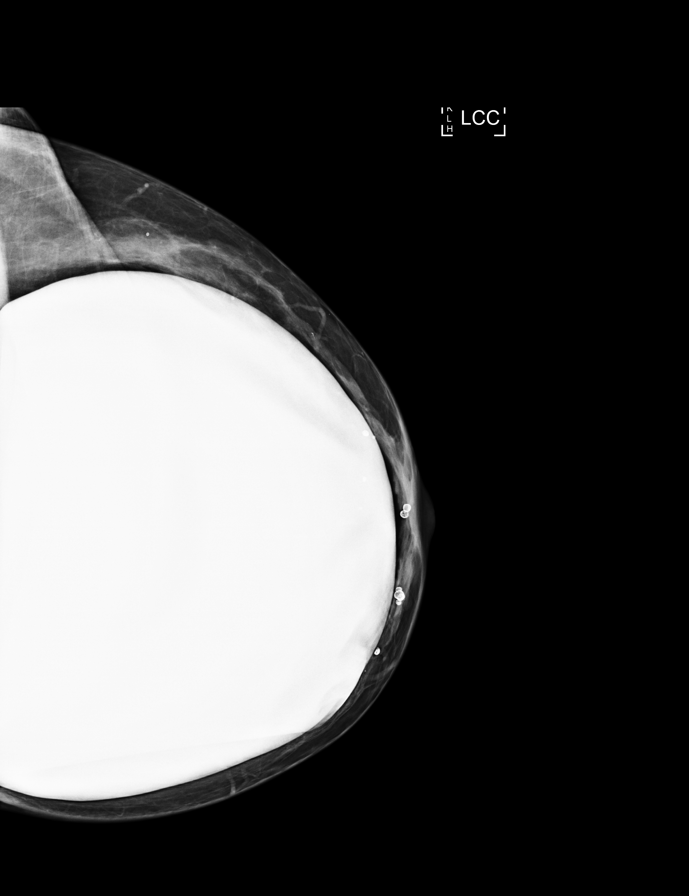

[R CC]
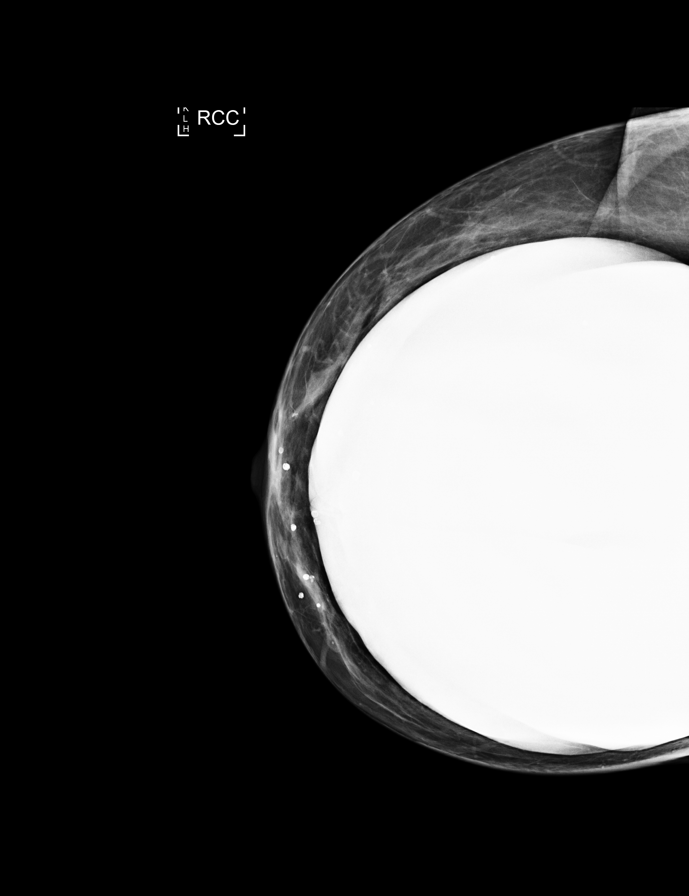

[L MLO]
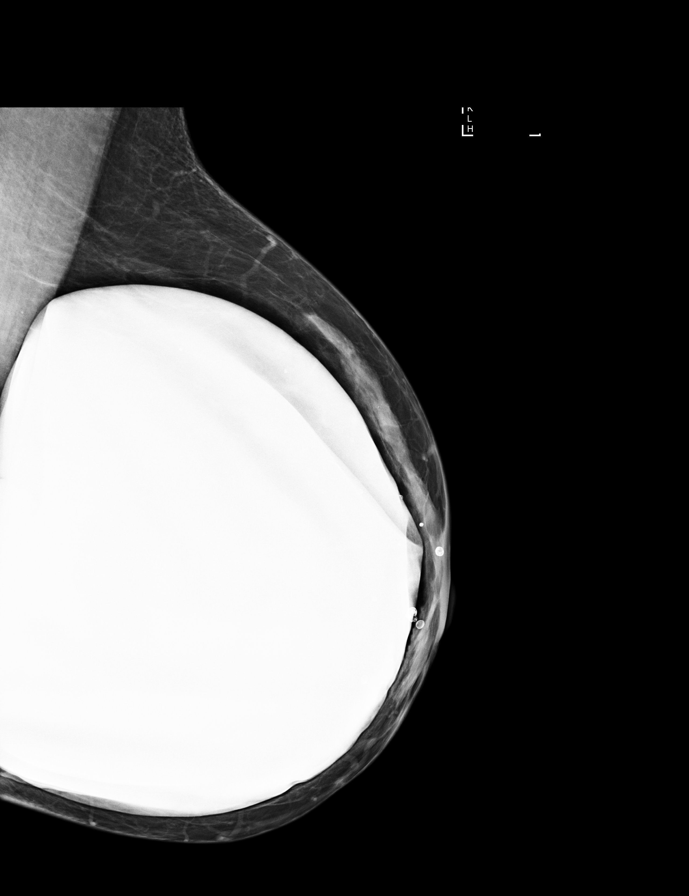

[R MLO]
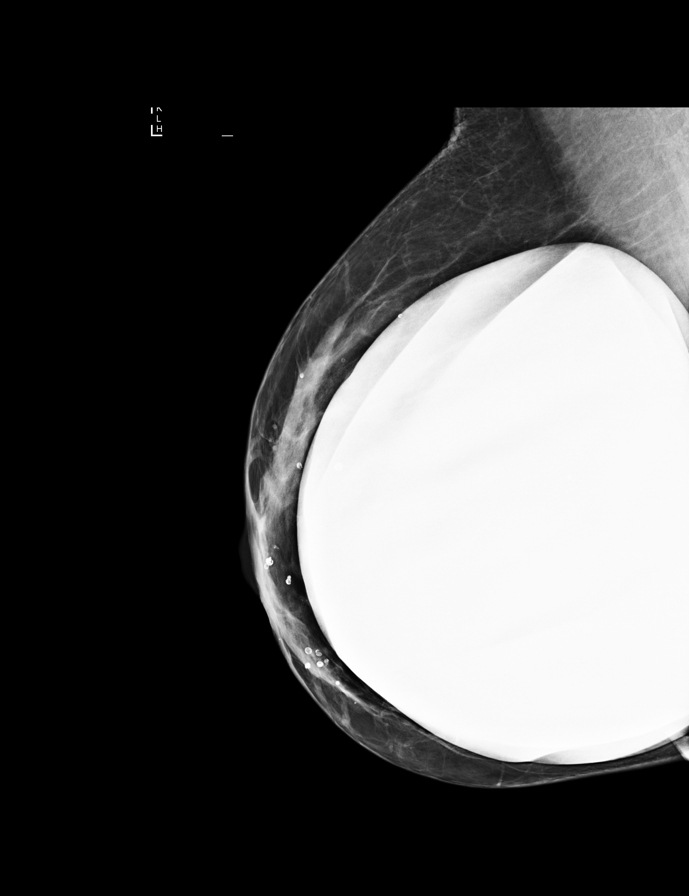

[R MLO synth-2D]
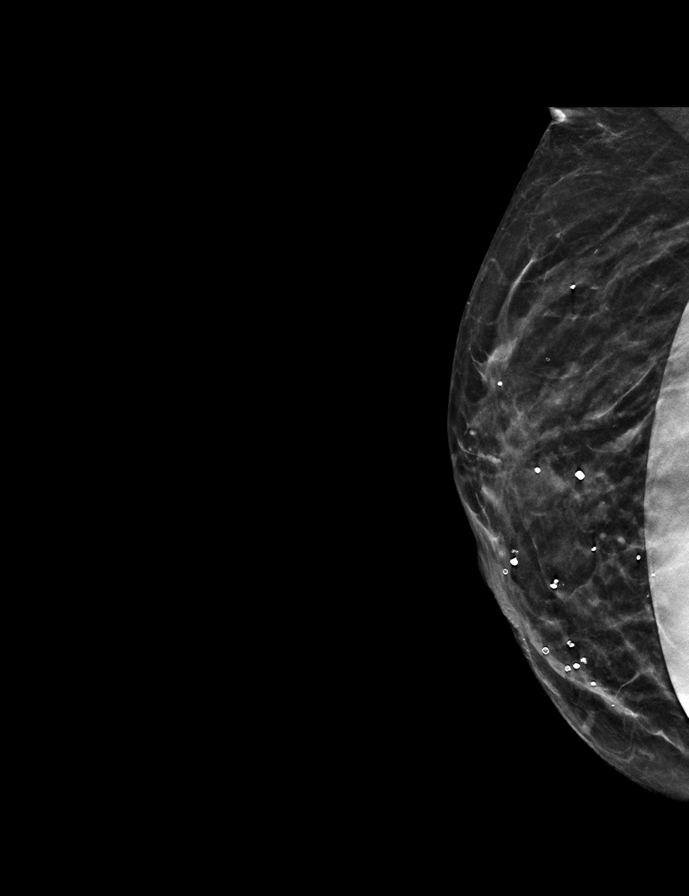

[L MLO synth-2D]
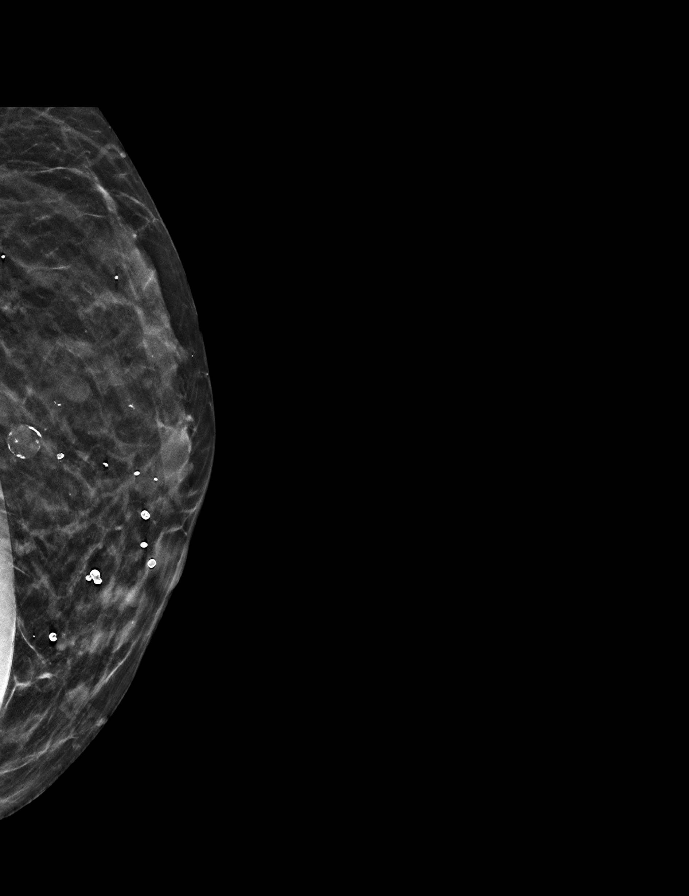

[R CC synth-2D]
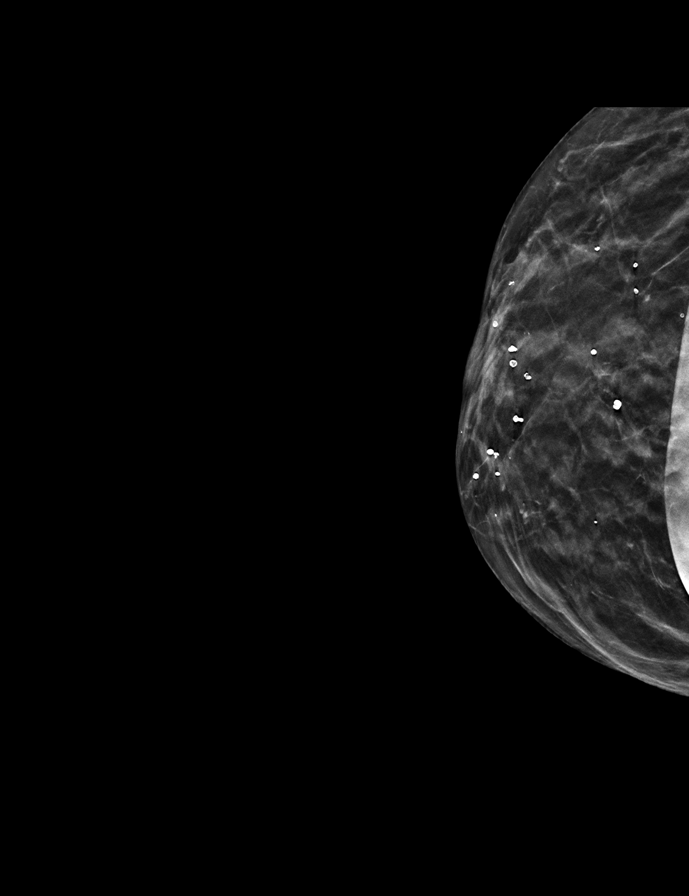

[L CC synth-2D]
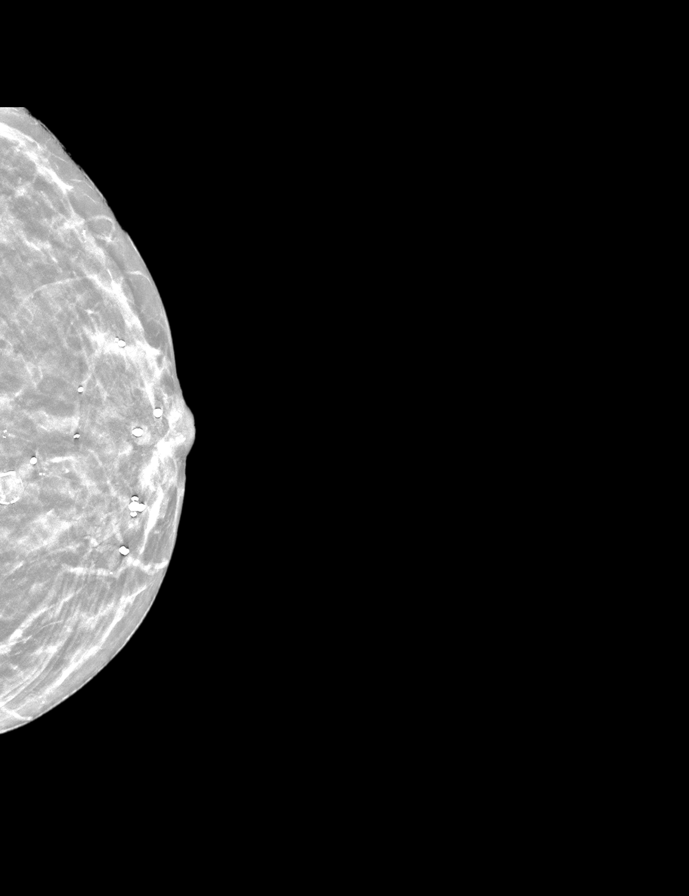

[R CCID BREAST TOMOSYNTHESIS IMAGE tomo · tomo slice 17/34.0]
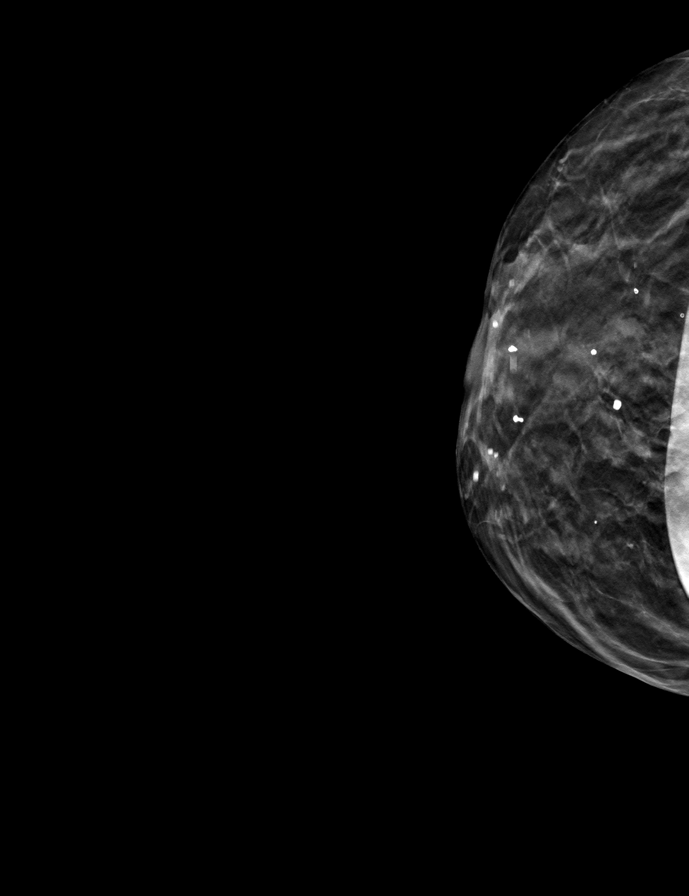

[9 of 28 positions shown; findings below may reference images not displayed]

ACR Breast Density Category c: The breast tissue is heterogeneously
dense, which may obscure small masses.
FINDINGS: There are no findings suspicious for malignancy. Images were
processed with CAD.
IMPRESSION: No mammographic evidence of malignancy. A result letter of this
screening mammogram will be mailed directly to the patient.

RECOMMENDATION:
Screening mammogram in one year. (Code:57-0-9UK)

BI-RADS CATEGORY  1:  Negative.

## 2020-06-20 ENCOUNTER — Encounter: Payer: Self-pay | Admitting: Gastroenterology

## 2020-08-12 ENCOUNTER — Ambulatory Visit: Payer: 59 | Admitting: Gastroenterology

## 2020-11-18 DIAGNOSIS — R69 Illness, unspecified: Secondary | ICD-10-CM | POA: Diagnosis not present

## 2020-11-18 DIAGNOSIS — R059 Cough, unspecified: Secondary | ICD-10-CM | POA: Diagnosis not present

## 2020-11-18 DIAGNOSIS — M791 Myalgia, unspecified site: Secondary | ICD-10-CM | POA: Diagnosis not present

## 2020-11-29 DIAGNOSIS — S0502XA Injury of conjunctiva and corneal abrasion without foreign body, left eye, initial encounter: Secondary | ICD-10-CM | POA: Diagnosis not present

## 2020-11-29 DIAGNOSIS — X58XXXA Exposure to other specified factors, initial encounter: Secondary | ICD-10-CM | POA: Diagnosis not present

## 2020-11-29 DIAGNOSIS — H5789 Other specified disorders of eye and adnexa: Secondary | ICD-10-CM | POA: Diagnosis not present

## 2020-12-09 DIAGNOSIS — H04129 Dry eye syndrome of unspecified lacrimal gland: Secondary | ICD-10-CM | POA: Diagnosis not present

## 2020-12-09 DIAGNOSIS — K5900 Constipation, unspecified: Secondary | ICD-10-CM | POA: Diagnosis not present

## 2020-12-09 DIAGNOSIS — Z79891 Long term (current) use of opiate analgesic: Secondary | ICD-10-CM | POA: Diagnosis not present

## 2020-12-09 DIAGNOSIS — E785 Hyperlipidemia, unspecified: Secondary | ICD-10-CM | POA: Diagnosis not present

## 2020-12-09 DIAGNOSIS — G43909 Migraine, unspecified, not intractable, without status migrainosus: Secondary | ICD-10-CM | POA: Diagnosis not present

## 2020-12-09 DIAGNOSIS — R053 Chronic cough: Secondary | ICD-10-CM | POA: Diagnosis not present

## 2020-12-09 DIAGNOSIS — R030 Elevated blood-pressure reading, without diagnosis of hypertension: Secondary | ICD-10-CM | POA: Diagnosis not present

## 2020-12-09 DIAGNOSIS — Z881 Allergy status to other antibiotic agents status: Secondary | ICD-10-CM | POA: Diagnosis not present

## 2020-12-09 DIAGNOSIS — Z880 Allergy status to penicillin: Secondary | ICD-10-CM | POA: Diagnosis not present

## 2020-12-15 DIAGNOSIS — R69 Illness, unspecified: Secondary | ICD-10-CM | POA: Diagnosis not present

## 2021-01-19 DIAGNOSIS — R69 Illness, unspecified: Secondary | ICD-10-CM | POA: Diagnosis not present

## 2021-01-22 DIAGNOSIS — Z0100 Encounter for examination of eyes and vision without abnormal findings: Secondary | ICD-10-CM | POA: Diagnosis not present

## 2021-02-23 DIAGNOSIS — R69 Illness, unspecified: Secondary | ICD-10-CM | POA: Diagnosis not present

## 2021-03-25 DIAGNOSIS — R69 Illness, unspecified: Secondary | ICD-10-CM | POA: Diagnosis not present

## 2021-04-27 DIAGNOSIS — R69 Illness, unspecified: Secondary | ICD-10-CM | POA: Diagnosis not present

## 2021-04-27 DIAGNOSIS — F4312 Post-traumatic stress disorder, chronic: Secondary | ICD-10-CM | POA: Diagnosis not present

## 2021-06-01 DIAGNOSIS — R69 Illness, unspecified: Secondary | ICD-10-CM | POA: Diagnosis not present

## 2021-06-01 DIAGNOSIS — F4312 Post-traumatic stress disorder, chronic: Secondary | ICD-10-CM | POA: Diagnosis not present

## 2021-07-06 DIAGNOSIS — R69 Illness, unspecified: Secondary | ICD-10-CM | POA: Diagnosis not present

## 2021-07-06 DIAGNOSIS — F4312 Post-traumatic stress disorder, chronic: Secondary | ICD-10-CM | POA: Diagnosis not present

## 2021-07-20 ENCOUNTER — Other Ambulatory Visit (HOSPITAL_COMMUNITY): Payer: Self-pay

## 2021-07-20 MED ORDER — TRAMADOL HCL 50 MG PO TABS
100.0000 mg | ORAL_TABLET | Freq: Two times a day (BID) | ORAL | 0 refills | Status: DC | PRN
  Filled 2021-07-20: qty 60, 15d supply, fill #0

## 2021-07-21 ENCOUNTER — Other Ambulatory Visit (HOSPITAL_COMMUNITY): Payer: Self-pay

## 2021-07-21 MED ORDER — TRAMADOL HCL 50 MG PO TABS
50.0000 mg | ORAL_TABLET | Freq: Two times a day (BID) | ORAL | 0 refills | Status: DC
  Filled 2021-07-21: qty 20, 10d supply, fill #0

## 2021-08-04 DIAGNOSIS — R69 Illness, unspecified: Secondary | ICD-10-CM | POA: Diagnosis not present

## 2021-08-04 DIAGNOSIS — F4312 Post-traumatic stress disorder, chronic: Secondary | ICD-10-CM | POA: Diagnosis not present

## 2021-08-23 ENCOUNTER — Encounter (HOSPITAL_COMMUNITY): Payer: Self-pay | Admitting: Emergency Medicine

## 2021-08-23 ENCOUNTER — Emergency Department (HOSPITAL_COMMUNITY): Payer: No Typology Code available for payment source

## 2021-08-23 ENCOUNTER — Other Ambulatory Visit: Payer: Self-pay

## 2021-08-23 ENCOUNTER — Emergency Department (HOSPITAL_COMMUNITY)
Admission: EM | Admit: 2021-08-23 | Discharge: 2021-08-23 | Disposition: A | Discharge status: Home / Self Care | Payer: No Typology Code available for payment source | Attending: Emergency Medicine | Admitting: Emergency Medicine

## 2021-08-23 DIAGNOSIS — N9489 Other specified conditions associated with female genital organs and menstrual cycle: Secondary | ICD-10-CM | POA: Diagnosis not present

## 2021-08-23 DIAGNOSIS — Z87891 Personal history of nicotine dependence: Secondary | ICD-10-CM | POA: Diagnosis not present

## 2021-08-23 DIAGNOSIS — R112 Nausea with vomiting, unspecified: Secondary | ICD-10-CM | POA: Insufficient documentation

## 2021-08-23 DIAGNOSIS — R1084 Generalized abdominal pain: Secondary | ICD-10-CM | POA: Diagnosis not present

## 2021-08-23 DIAGNOSIS — R197 Diarrhea, unspecified: Secondary | ICD-10-CM | POA: Diagnosis not present

## 2021-08-23 DIAGNOSIS — R6883 Chills (without fever): Secondary | ICD-10-CM | POA: Diagnosis not present

## 2021-08-23 DIAGNOSIS — R1031 Right lower quadrant pain: Secondary | ICD-10-CM | POA: Diagnosis present

## 2021-08-23 DIAGNOSIS — N201 Calculus of ureter: Secondary | ICD-10-CM

## 2021-08-23 LAB — URINALYSIS, MICROSCOPIC (REFLEX): WBC, UA: NONE SEEN WBC/hpf (ref 0–5)

## 2021-08-23 LAB — COMPREHENSIVE METABOLIC PANEL
ALT: 24 U/L (ref 0–44)
AST: 20 U/L (ref 15–41)
Albumin: 4 g/dL (ref 3.5–5.0)
Alkaline Phosphatase: 54 U/L (ref 38–126)
Anion gap: 10 (ref 5–15)
BUN: 17 mg/dL (ref 6–20)
CO2: 25 mmol/L (ref 22–32)
Calcium: 9.5 mg/dL (ref 8.9–10.3)
Chloride: 100 mmol/L (ref 98–111)
Creatinine, Ser: 0.94 mg/dL (ref 0.44–1.00)
GFR, Estimated: >60 mL/min (ref >60)
Glucose, Bld: 145 mg/dL — ABNORMAL HIGH (ref 70–99)
Potassium: 3.9 mmol/L (ref 3.5–5.1)
Sodium: 135 mmol/L (ref 135–145)
Total Bilirubin: 0.5 mg/dL (ref 0.3–1.2)
Total Protein: 6.8 g/dL (ref 6.5–8.1)

## 2021-08-23 LAB — CBC WITH DIFFERENTIAL/PLATELET
Abs Immature Granulocytes: 0.01 10*3/uL (ref 0.00–0.07)
Basophils Absolute: 0 10*3/uL (ref 0.0–0.1)
Basophils Relative: 1 %
Eosinophils Absolute: 0.1 10*3/uL (ref 0.0–0.5)
Eosinophils Relative: 1 %
HCT: 39.6 % (ref 36.0–46.0)
Hemoglobin: 13.2 g/dL (ref 12.0–15.0)
Immature Granulocytes: 0 %
Lymphocytes Relative: 13 %
Lymphs Abs: 0.9 10*3/uL (ref 0.7–4.0)
MCH: 29.5 pg (ref 26.0–34.0)
MCHC: 33.3 g/dL (ref 30.0–36.0)
MCV: 88.6 fL (ref 80.0–100.0)
Monocytes Absolute: 0.4 10*3/uL (ref 0.1–1.0)
Monocytes Relative: 6 %
Neutro Abs: 5.4 10*3/uL (ref 1.7–7.7)
Neutrophils Relative %: 79 %
Platelets: 201 10*3/uL (ref 150–400)
RBC: 4.47 MIL/uL (ref 3.87–5.11)
RDW: 12.7 % (ref 11.5–15.5)
WBC: 6.8 10*3/uL (ref 4.0–10.5)
nRBC: 0 % (ref 0.0–0.2)

## 2021-08-23 LAB — URINALYSIS, ROUTINE W REFLEX MICROSCOPIC
Bilirubin Urine: NEGATIVE
Glucose, UA: NEGATIVE mg/dL
Ketones, ur: NEGATIVE mg/dL
Leukocytes,Ua: NEGATIVE
Nitrite: NEGATIVE
Protein, ur: NEGATIVE mg/dL
Specific Gravity, Urine: >1.03 — ABNORMAL HIGH (ref 1.005–1.030)
pH: 5.5 (ref 5.0–8.0)

## 2021-08-23 LAB — I-STAT BETA HCG BLOOD, ED (MC, WL, AP ONLY): I-stat hCG, quantitative: <5 m[IU]/mL (ref <5)

## 2021-08-23 LAB — LIPASE, BLOOD: Lipase: 43 U/L (ref 11–51)

## 2021-08-23 MED ORDER — KETOROLAC TROMETHAMINE 10 MG PO TABS: 10.0000 mg | ORAL_TABLET | Freq: Four times a day (QID) | ORAL | 0 refills | Status: DC | PRN

## 2021-08-23 MED ORDER — MORPHINE SULFATE (PF) 2 MG/ML IV SOLN
2.0000 mg | Freq: Once | INTRAVENOUS | Status: AC
  Administered 2021-08-23: 13:00:00 2 mg via INTRAVENOUS
  Filled 2021-08-23: qty 1

## 2021-08-23 MED ORDER — SODIUM CHLORIDE 0.9 % IV BOLUS
1000.0000 mL | Freq: Once | INTRAVENOUS | Status: AC
  Administered 2021-08-23: 13:00:00 1000 mL via INTRAVENOUS

## 2021-08-23 MED ORDER — MORPHINE SULFATE (PF) 4 MG/ML IV SOLN
4.0000 mg | Freq: Once | INTRAVENOUS | Status: AC
  Administered 2021-08-23: 12:00:00 4 mg via INTRAVENOUS
  Filled 2021-08-23: qty 1

## 2021-08-23 MED ORDER — HYDROCODONE-ACETAMINOPHEN 5-325 MG PO TABS
1.0000 | ORAL_TABLET | Freq: Once | ORAL | Status: AC
  Administered 2021-08-23: 16:00:00 1 via ORAL
  Filled 2021-08-23: qty 1

## 2021-08-23 MED ORDER — KETOROLAC TROMETHAMINE 15 MG/ML IJ SOLN
15.0000 mg | Freq: Once | INTRAMUSCULAR | Status: AC
  Administered 2021-08-23: 14:00:00 15 mg via INTRAVENOUS
  Filled 2021-08-23: qty 1

## 2021-08-23 MED ORDER — ONDANSETRON HCL 4 MG/2ML IJ SOLN
4.0000 mg | Freq: Once | INTRAMUSCULAR | Status: AC
  Administered 2021-08-23: 12:00:00 4 mg via INTRAVENOUS
  Filled 2021-08-23: qty 2

## 2021-08-23 MED ORDER — HYDROCODONE-ACETAMINOPHEN 5-325 MG PO TABS: 1.0000 | ORAL_TABLET | Freq: Four times a day (QID) | ORAL | 0 refills | Status: DC | PRN

## 2021-08-23 MED ORDER — HYDROMORPHONE HCL 1 MG/ML IJ SOLN
1.0000 mg | Freq: Once | INTRAMUSCULAR | Status: AC
  Administered 2021-08-23: 14:00:00 1 mg via INTRAVENOUS
  Filled 2021-08-23: qty 1

## 2021-08-23 MED ORDER — IOHEXOL 300 MG/ML  SOLN
100.0000 mL | Freq: Once | INTRAMUSCULAR | Status: AC | PRN
  Administered 2021-08-23: 13:00:00 100 mL via INTRAVENOUS

## 2021-08-23 MED ORDER — HYDROCODONE-ACETAMINOPHEN 5-325 MG PO TABS: 1.0000 | ORAL_TABLET | Freq: Four times a day (QID) | ORAL | 0 refills | Status: AC | PRN

## 2021-08-23 MED ORDER — ONDANSETRON 4 MG PO TBDP: 4.0000 mg | ORAL_TABLET | Freq: Three times a day (TID) | ORAL | 0 refills | Status: DC | PRN

## 2021-08-23 NOTE — ED Provider Notes (Signed)
[facility] EMERGENCY DEPARTMENT Provider Note   CSN: [id number] Arrival date & time: 08/23/21  1039     History Chief Complaint  Patient presents with   Abdominal Pain    Dana Sanders is a 60 y.o. female.  60 year old female presents today with acute onset of abdominal pain mid morning.  She endorses nausea, vomiting, diarrhea, and chills.  She denies recent illness or abdominal pain prior to this morning.  Pain is primarily in the right upper and right lower quadrant.    The history is provided by the patient. No language interpreter was used.  Abdominal Pain Pain location:  RLQ Pain quality: sharp and stabbing   Pain radiates to:  Does not radiate Pain severity:  Severe Onset quality:  Sudden Duration:  3 hours Timing:  Constant Progression:  Unchanged Chronicity:  New Relieved by:  Nothing Worsened by:  Movement and palpation Ineffective treatments:  None tried Associated symptoms: chills, diarrhea, nausea and vomiting   Associated symptoms: no fever and no shortness of breath       Past Medical History:  Diagnosis Date   Anxiety    Depression    Headache    migraines in the past   History of kidney stones    Lichen sclerosus    Low iron    Pneumonia    PONV (postoperative nausea and vomiting)     Patient Active Problem List   Diagnosis Date Noted   Closed fracture of right distal radius and ulna, initial encounter 04/12/2018   Vaginal atrophy 02/04/2015   Menopause 02/04/2015   Menopausal symptoms 01/16/2015   Vulvar lesion 01/16/2015   Vulvar irritation 01/16/2015   Nephrolithiasis 07/24/2014   Ureteral stone with hydronephrosis 07/24/2014   Cough, persistent 09/21/2012   Chronic cough 03/23/2011    Past Surgical History:  Procedure Laterality Date   AUGMENTATION MAMMAPLASTY Bilateral 2012   BREAST CYST ASPIRATION  12/12/2012   BREAST SURGERY     rt bx   CARPAL TUNNEL RELEASE Right 08/18/2018   Procedure: Right  limited open carpal tunnel release;  Surgeon: Roseanne Kaufman, MD;  Location: [facility];  Service: Orthopedics;  Laterality: Right;  Local with IV Sed   CYSTOSCOPY W/ RETROGRADES Left 07/24/2014   Procedure: cYSTOSCOPY WITH RETROGRADE PYELOGRAM dilation left ureter basket extraction left ureteral stone left double j stent;  Surgeon: Ailene Rud, MD;  [facility];  Service: Urology;  Laterality: Left;   CYSTOSCOPY WITH STENT PLACEMENT Left 07/24/2014   Procedure: CYSTOSCOPY WITH STENT PLACEMENT;  Surgeon: Ailene Rud, MD;  Location[facility];  Service: Urology;  Laterality: Left;   FINGER ARTHRODESIS Right 02/06/2013   Procedure: ARTHRODESIS FINGER;  Surgeon: Cammie Sickle., MD;  Location: [facility];  Service: Orthopedics;  Laterality: Right;  Right Index Metacarpal phalangeal jOint Arthrodesis   OPEN REDUCTION INTERNAL FIXATION (ORIF) DISTAL RADIAL FRACTURE Right 04/12/2018   Procedure: OPEN REDUCTION INTERNAL FIXATION (ORIF) DISTAL RADIAL FRACTURE;  Surgeon: Roseanne Kaufman, MD;  Location: [facility];  Service: Orthopedics;  Laterality: Right;   septorhinoplasty  1988   SHOULDER ARTHROSCOPY WITH ROTATOR CUFF REPAIR Right 06/08/2018   Procedure: SHOULDER ARTHROSCOPY WITH ROTATOR CUFF REPAIR [place] AND SUBCAPSULARIS REPAIR;  Surgeon: Tania Ade, MD;  Location: [facility];  Service: Orthopedics;  Laterality: Right;   SHOULDER SURGERY     bilateral x5   TOTAL ABDOMINAL HYSTERECTOMY  1999     OB History     Gravida  2   Para  2   Term      Preterm      AB      Living  2      SAB      IAB      Ectopic      Multiple      Live Births              Family History  Problem Relation Age of Onset   Heart disease Father        pacemaker   Leukemia Daughter    Allergies Son    Rheum arthritis Sister     Social History   Tobacco Use   Smoking status: Former    Packs/day: 0.20    Years: 15.00    Pack years: 3.00     Types: Cigarettes    Quit date: 09/20/2005    Years since quitting: 15.9   Smokeless tobacco: Never  Vaping Use   Vaping Use: Never used  Substance Use Topics   Alcohol use: Yes    Alcohol/week: 0.0 standard drinks    Comment: OCC   Drug use: No    Home Medications Prior to Admission medications   Medication Sig Start Date End Date Taking? Authorizing Provider  gabapentin (NEURONTIN) 100 MG capsule Take 200 mg by mouth 3 (three) times daily. 07/20/18   [provider]  methocarbamol (ROBAXIN) 500 MG tablet Take 1 tablet (500 mg total) by mouth 2 (two) times daily. Patient not taking: Reported on 08/11/2018 04/11/18   Providence Lanius A, PA-C  simvastatin (ZOCOR) 10 MG tablet Take 10 mg by mouth daily.    [provider]  traMADol (ULTRAM) 50 MG tablet Take 50 mg by mouth every 6 (six) hours as needed (for pain.).     [provider]    Allergies    Other, Erythromycin, and Penicillins  Review of Systems   Review of Systems  Constitutional:  Positive for chills. Negative for fever.  Respiratory:  Negative for shortness of breath.   Gastrointestinal:  Positive for abdominal pain, diarrhea, nausea and vomiting.  Genitourinary:  Negative for difficulty urinating.  Musculoskeletal:  Negative for back pain.  Neurological:  Negative for light-headedness.  All other systems reviewed and are negative.  Physical Exam Updated Vital Signs BP (!) 158/97   Pulse 69   Resp 14   SpO2 99%   Physical Exam Vitals and nursing note reviewed.  Constitutional:      General: She is not in acute distress.    Appearance: Normal appearance. She is not ill-appearing.  HENT:     Head: Normocephalic and atraumatic.     Nose: Nose normal.  Eyes:     General: No scleral icterus.    Extraocular Movements: Extraocular movements intact.     Conjunctiva/sclera: Conjunctivae normal.  Cardiovascular:     Rate and Rhythm: Normal rate and regular rhythm.     Pulses: Normal  pulses.     Heart sounds: Normal heart sounds.  Pulmonary:     Effort: Pulmonary effort is normal. No respiratory distress.     Breath sounds: Normal breath sounds. No wheezing or rales.  Abdominal:     General: There is no distension.     Palpations: Abdomen is soft.     Tenderness: There is generalized abdominal tenderness and tenderness in the right upper quadrant and right lower quadrant. There is guarding. There is no right CVA tenderness or left CVA tenderness.  Musculoskeletal:        General: Normal range of motion.     Cervical back: Normal range of motion.  Skin:    General: Skin is warm and dry.  Neurological:     General: No focal deficit present.     Mental Status: She is alert. Mental status is at baseline.    ED Results / Procedures / Treatments   Labs (all labs ordered are listed, but only abnormal results are displayed) Labs Reviewed  COMPREHENSIVE METABOLIC PANEL - Abnormal; Notable for the following components:      Result Value   Glucose, Bld 145 (*)    All other components within normal limits  URINALYSIS, ROUTINE W REFLEX MICROSCOPIC - Abnormal; Notable for the following components:   Specific Gravity, Urine >1.030 (*)    Hgb urine dipstick LARGE (*)    All other components within normal limits  URINALYSIS, MICROSCOPIC (REFLEX) - Abnormal; Notable for the following components:   Bacteria, UA RARE (*)    All other components within normal limits  CBC WITH DIFFERENTIAL/PLATELET  LIPASE, BLOOD  I-STAT BETA HCG BLOOD, ED ([facility], [facility], [facility] ONLY)    EKG None  Radiology No results found.  Procedures Procedures   Medications Ordered in ED Medications  sodium chloride 0.9 % bolus 1,000 mL (has no administration in time range)  morphine 4 MG/ML injection 4 mg (4 mg Intravenous Given 08/23/21 1201)  ondansetron (ZOFRAN) injection 4 mg (4 mg Intravenous Given 08/23/21 1201)  morphine 2 MG/ML injection 2 mg (2 mg Intravenous Given 08/23/21 1237)    ED Course   I have reviewed the triage vital signs and the nursing notes.  Pertinent labs & imaging results that were available during my care of the patient were reviewed by me and considered in my medical decision making (see chart for details).    MDM Rules/Calculators/A&P                           59 year old female presents today with sudden onset of right lower quadrant abdominal pain with associated nausea, vomiting, diarrhea, and chills.  We will give patient nausea and pain medication for relief.  Blood work and CT ordered.  She does have a history of nephrolithiasis.  However she states this pain is worse than they think she has had prior.  2015 she did have 3 mm stone in her left UV junction which required ureter dilation and basket extraction.   Patient has required 2 rounds of morphine without improvement in pain.  She received Dilaudid 1 mg with Toradol with complete resolution of her pain.  CBC without leukocytosis, CMP with glucose 145 otherwise unremarkable, lipase of 43, UA with hemoglobin but without signs of UTI.  CT significant for 3 mm stone at the UV junction with associated hydronephrosis and hydroureter.  There is also calyceal rupture and urine leak noted.  CT scan discussed with urologist who states a calyceal rupture is a reassuring finding.  Given the location and size of the stone patient should likely pass this on her own.  Urology recommend follow-up outpatient as long as her pain is controlled.  Patient monitored for 2 hours following her last dose of pain medication without recurrence.  She remains comfortable.  She is appropriate for discharge.  Will prescribe pain medication for discharge.  We will provide her with urology information for follow-up.  Return precautions discussed including fever, uncontrolled nausea vomiting, or severe pain.  She and her husband voice understanding and are in agreement with plan.   Final Clinical Impression(s) / ED Diagnoses Final  diagnoses:  None    Rx / DC Orders ED Discharge Orders     None        Evlyn Courier, PA-C 08/23/21 1550    Davonna Belling, MD 08/23/21 1906

## 2021-08-23 NOTE — Discharge Instructions (Addendum)
You have a kidney stone.  You were given pain medication with improvement in your pain.  You are monitored for couple hours after and you remain comfortable.  He received a dose of Norco prior to discharge.  I have sent in prescription for Norco and your pharmacy as well as Toradol.  You have Ultram at home that you take.  I recommend that you not take Norco and Ultram at the same time.  Given the severity of your pain you can either take Norco and hold off on Ultram.  During our discussion you decided you will continue taking Ultram and use Norco for breakthrough pain only.  Recommend you drink plenty of fluids.  I have attached urology information above for you to call and schedule a clinic follow-up appointment.  If you develop fever, severe pain, nausea vomiting to the point you are unable to keep any food or drink down please return to the emergency room for evaluation.

## 2021-08-23 NOTE — ED Provider Notes (Signed)
Emergency Medicine Provider Triage Evaluation Note  Dana Sanders , a 60 y.o. female  was evaluated in triage.  Pt complains of sudden right lower quadrant pain onset this morning.  Patient has associated nausea, vomiting, diarrhea, chills.  Patient is nitrate medication for symptoms.  Patient denies chest pain, shortness of breath, fever.  Patient reports she still has her ovaries, gallbladder, and appendix.  Review of Systems  Positive: Right lower quadrant abdominal pain Negative: Chest pain, shortness of breath  Physical Exam  BP 130/82   Pulse 66   Resp 12   SpO2 99%  Gen:   Awake, no distress   Resp:  Normal effort  MSK:   Moves extremities without difficulty  Other:  Moderate tenderness to palpation to right lower quadrant.  Medical Decision Making  Medically screening exam initiated at 11:11 AM.  Appropriate orders placed.  Silvio Clayman was informed that the remainder of the evaluation will be completed by another provider, this initial triage assessment does not replace that evaluation, and the importance of remaining in the ED until their evaluation is complete.     Javohn Basey A, PA-C 08/23/21 1112    Pattricia Boss, MD 08/25/21 [phone]

## 2021-08-23 NOTE — ED Notes (Signed)
Unable to obtain temperature at triage.  Pt asked for [place] to get it later due to pain.

## 2021-08-23 NOTE — ED Triage Notes (Signed)
C/o severe RLQ pain with nausea, vomiting, diarrhea, and chills that started suddenly this morning.

## 2021-09-03 DIAGNOSIS — F4312 Post-traumatic stress disorder, chronic: Secondary | ICD-10-CM | POA: Diagnosis not present

## 2021-09-03 DIAGNOSIS — R69 Illness, unspecified: Secondary | ICD-10-CM | POA: Diagnosis not present

## 2021-10-05 DIAGNOSIS — F4312 Post-traumatic stress disorder, chronic: Secondary | ICD-10-CM | POA: Diagnosis not present

## 2021-11-11 ENCOUNTER — Ambulatory Visit (INDEPENDENT_AMBULATORY_CARE_PROVIDER_SITE_OTHER): Payer: No Typology Code available for payment source | Admitting: Student

## 2021-11-11 ENCOUNTER — Other Ambulatory Visit: Payer: Self-pay

## 2021-11-11 ENCOUNTER — Ambulatory Visit (INDEPENDENT_AMBULATORY_CARE_PROVIDER_SITE_OTHER): Payer: No Typology Code available for payment source

## 2021-11-11 ENCOUNTER — Encounter: Payer: Self-pay | Admitting: Student

## 2021-11-11 VITALS — BP 94/60 | HR 97 | Temp 98.3°F | Ht 61.0 in | Wt 129.0 lb

## 2021-11-11 DIAGNOSIS — R053 Chronic cough: Secondary | ICD-10-CM

## 2021-11-11 NOTE — Progress Notes (Signed)
Synopsis: Referred for chronic cough by Reeves Dam, MD  Subjective:   PATIENT ID: Dana Sanders GENDER: female DOB: Feb 01, 1961, MRN: [id number]  Chief Complaint  Patient presents with   Pulmonary Consult    Referred by Fortunato Curling for eval of chronic cough- onset of cough at least 10 years ago. She states it comes and goes usually starts November to March. Her cough is non prod and occurs without any specific trigger.    61yF veteran with history of chronic cough previously followed by Dr. Gwenette Greet, anxiety/depression/PTSD, insomnia seen previously by Dr. Halford Chessman.   Did get covid-19 08/2021 after Christmas.  [facility]  for ureterolithiasis  She says she has cough typically October-March lasting 2-3 months at a time. Very severe. The only thing that seems to help is ultram. Has been on ultram for last several years. Cough has actually improved since the time that her referral was placed back in November. Typically very dry cough. When it's active she does have hoarseness, throat tightness but she's always thought it was due to just coughing a lot.   Has had 8 week trial of ppi in past without improvement. She has no sinus congestion. Never has seen speech.   She has never noted improvement with steroids.   She has tried gabapentin but got foggy on it and lost in [place]. Never wants to try anything like it again.   Otherwise pertinent review of systems is negative.  [employer] - was stationed all over. Lost a daughter to leukemia. She has a dog. Smoked a long time ago for a few years. No vaping. No marijuana.    Past Medical History:  Diagnosis Date   Anxiety    Depression    Headache    migraines in the past   History of kidney stones    Lichen sclerosus    Low iron    Pneumonia    PONV (postoperative nausea and vomiting)      Family History  Problem Relation Age of Onset   Heart disease Father        pacemaker   Leukemia Daughter     Allergies Son    Rheum arthritis Sister      Past Surgical History:  Procedure Laterality Date   AUGMENTATION MAMMAPLASTY Bilateral 2012   BREAST CYST ASPIRATION  12/12/2012   BREAST SURGERY     rt bx   CARPAL TUNNEL RELEASE Right 08/18/2018   Procedure: Right limited open carpal tunnel release;  Surgeon: Roseanne Kaufman, MD;  Location: [facility];  Service: Orthopedics;  Laterality: Right;  Local with IV Sed   CYSTOSCOPY W/ RETROGRADES Left 07/24/2014   Procedure: cYSTOSCOPY WITH RETROGRADE PYELOGRAM dilation left ureter basket extraction left ureteral stone left double j stent;  Surgeon: Ailene Rud, MD;  [facility];  Service: Urology;  Laterality: Left;   CYSTOSCOPY WITH STENT PLACEMENT Left 07/24/2014   Procedure: CYSTOSCOPY WITH STENT PLACEMENT;  Surgeon: Ailene Rud, MD;  [facility];  Service: Urology;  Laterality: Left;   FINGER ARTHRODESIS Right 02/06/2013   Procedure: ARTHRODESIS FINGER;  Surgeon: Cammie Sickle., MD;  Location: [place];  Service: Orthopedics;  Laterality: Right;  Right Index Metacarpal phalangeal jOint Arthrodesis   OPEN REDUCTION INTERNAL FIXATION (ORIF) DISTAL RADIAL FRACTURE Right 04/12/2018   Procedure: OPEN REDUCTION INTERNAL FIXATION (ORIF) DISTAL RADIAL FRACTURE;  Surgeon: Roseanne Kaufman, MD;  Location: [place];  Service: Orthopedics;  Laterality:  Right;   septorhinoplasty  1988   SHOULDER ARTHROSCOPY WITH ROTATOR CUFF REPAIR Right 06/08/2018   Procedure: SHOULDER ARTHROSCOPY WITH ROTATOR CUFF REPAIR [place] AND SUBCAPSULARIS REPAIR;  Surgeon: Tania Ade, MD;  Location: [place];  Service: Orthopedics;  Laterality: Right;   SHOULDER SURGERY     bilateral x5   TOTAL ABDOMINAL HYSTERECTOMY  1999    Social History   Socioeconomic History   Marital status: Divorced    Spouse name: Not on file   Number of children: Y   Years of education: Not on file   Highest education level: Not  on file  Occupational History   Occupation: OR/Surgical Engineer, production: [employer]  Tobacco Use   Smoking status: Former    Packs/day: 0.20    Years: 15.00    Pack years: 3.00    Types: Cigarettes    Quit date: 09/20/2005    Years since quitting: 16.1   Smokeless tobacco: Never  Vaping Use   Vaping Use: Never used  Substance and Sexual Activity   Alcohol use: Yes    Alcohol/week: 0.0 standard drinks    Comment: OCC   Drug use: No   Sexual activity: Yes    Comment: 1st intercourse- 76,  partners- greater than 5  Other Topics Concern   Not on file  Social History Narrative   Not on file   Social Determinants of Health   Financial Resource Strain: Not on file  Food Insecurity: Not on file  Transportation Needs: Not on file  Physical Activity: Not on file  Stress: Not on file  Social Connections: Not on file  Intimate Partner Violence: Not on file     Allergies  Allergen Reactions   Other Anaphylaxis    FOOD ALLERGY: 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      Outpatient Medications Prior to Visit  Medication Sig Dispense Refill   ondansetron (ZOFRAN-ODT) 4 MG disintegrating tablet Take 1 tablet (4 mg total) by mouth every 8 (eight) hours as needed for nausea or vomiting. 20 tablet 0   traMADol (ULTRAM) 50 MG tablet Take 50 mg by mouth every 6 (six) hours as needed (for pain.).      UNABLE TO FIND Med Name: oxycarbazepine 300 mg three times daily     gabapentin (NEURONTIN) 100 MG capsule Take  200 mg by mouth 3 (three) times daily.  5   ketorolac (TORADOL) 10 MG tablet Take 1 tablet (10 mg total) by mouth every 6 (six) hours as needed. 20 tablet 0   methocarbamol (ROBAXIN) 500 MG tablet Take 1 tablet (500 mg total) by mouth 2 (two) times daily. (Patient not taking: Reported on 08/11/2018) 20 tablet 0   simvastatin (ZOCOR) 10 MG tablet Take 10 mg by mouth daily.     No facility-administered medications prior to visit.       Objective:   Physical Exam:  General appearance: 61 y.o., female, NAD, conversant  Eyes: anicteric sclerae; PERRL, tracking appropriately HENT: NCAT; MMM Neck: Trachea midline;  no lymphadenopathy, no JVD Lungs: CTAB, no crackles, no wheeze, with normal respiratory effort CV: RRR, no murmur  Abdomen: Soft, non-tender; non-distended, BS present  Extremities: No peripheral edema, warm Skin: Normal turgor and texture; no rash Psych: Appropriate affect Neuro: Alert and oriented to person and place, no focal deficit     Vitals:   11/11/21 1459  BP: 94/60  Pulse: 97  Temp: 98.3 F (36.8 C)  TempSrc: Oral  SpO2: 98%  Weight: 129 lb (58.5 kg)  Height: 5\' 1"  (1.549 m)   98% on RA BMI Readings from Last 3 Encounters:  11/11/21 24.37 kg/m  08/18/18 24.56 kg/m  06/08/18 24.95 kg/m   Wt Readings from Last 3 Encounters:  11/11/21 129 lb (58.5 kg)  08/18/18 130 lb (59 kg)  06/08/18 132 lb 0.9 oz (59.9 kg)     CBC    Component Value Date/Time   WBC 6.8 08/23/2021 1110   RBC 4.47 08/23/2021 1110   HGB 13.2 08/23/2021 1110   HCT 39.6 08/23/2021 1110   PLT 201 08/23/2021 1110   MCV 88.6 08/23/2021 1110   MCH 29.5 08/23/2021 1110   MCHC 33.3 08/23/2021 1110   RDW 12.7 08/23/2021 1110   LYMPHSABS 0.9 08/23/2021 1110   MONOABS 0.4 08/23/2021 1110   EOSABS 0.1 08/23/2021 1110   BASOSABS 0.0 08/23/2021 1110   Eos 100-400  Chest Imaging: CT A/P lung bases reviewed by me unremarkable  CXR 2016 reviewed by me unremarkable  Pulmonary  Functions Testing Results: No flowsheet data found.    Assessment & Plan:   #Chronic cough: Has had extensive unrevealing workup. Seen in past by ENT, pulm, PCP. Spirometry historically not suggestive of asthma, didn't respond to 8 week trial of ppi. Tried gabapentin but she got lost driving in [place] when she took it. Tramadol per pt and Dr. Gwenette Greet has worked in past.   Plan: - voice/speech referral placed today - suspect functional cough issue, VCD possible but lower suspicion   Maryjane Hurter, MD [facility] 11/11/2021 3:32 PM

## 2021-11-11 NOTE — Patient Instructions (Signed)
-   referral placed for speech/voice evaluation for cough in case there is component of vocal cord dysfunction or other functional cough issue for which they can offer guidance on maneuvers to prevent/self-manage cough - see you in 4 months

## 2021-11-16 ENCOUNTER — Ambulatory Visit: Payer: PPO | Admitting: Podiatry

## 2021-11-19 DIAGNOSIS — F4312 Post-traumatic stress disorder, chronic: Secondary | ICD-10-CM | POA: Diagnosis not present

## 2022-01-07 DIAGNOSIS — F4312 Post-traumatic stress disorder, chronic: Secondary | ICD-10-CM | POA: Diagnosis not present

## 2022-03-03 DIAGNOSIS — F4312 Post-traumatic stress disorder, chronic: Secondary | ICD-10-CM | POA: Diagnosis not present

## 2022-03-04 NOTE — Progress Notes (Signed)
Synopsis: Referred for chronic cough by Lajean Manes, MD  Subjective:   PATIENT ID: Dana Sanders GENDER: female DOB: Jul 02, 1961, MRN: [id number]  Chief Complaint  Patient presents with   Follow-up    Cough is unchanged.    61yF veteran with history of chronic cough previously followed by Dr. Gwenette Greet, anxiety/depression/PTSD, insomnia seen previously by Dr. Halford Chessman.   Did get covid-19 08/2021 after Christmas.  [facility]  for ureterolithiasis  She says she has cough typically October-March lasting 2-3 months at a time. Very severe. The only thing that seems to help is ultram. Has been on ultram for last several years. Cough has actually improved since the time that her referral was placed back in November. Typically very dry cough. When it's active she does have hoarseness, throat tightness but she's always thought it was due to just coughing a lot.   Has had 8 week trial of ppi in past without improvement. She has no sinus congestion. Never has seen speech.   She has never noted improvement with steroids.   She has tried gabapentin but got foggy on it and lost in [place]. Never wants to try anything like it again.   [employer] - was stationed all over. Lost a daughter to leukemia. She has a dog. Smoked a long time ago for a few years. No vaping. No marijuana.  Interval HPI:  Referred to voice/speech at last visit for possible functional cough vs lower suspicion VCD.  She went to ENT at [facility]. She says they did not do FNL exam. No issues with cough since last visit. She again anticipates that it'll happen in November/December. No DOE.    No new issues with sinus congestion, postnasal drainage, reflux.   Otherwise pertinent review of systems is negative.  Past Medical History:  Diagnosis Date   Anxiety    Depression    Headache    migraines in the past   History of kidney stones    Lichen sclerosus    Low iron    Pneumonia    PONV  (postoperative nausea and vomiting)      Family History  Problem Relation Age of Onset   Heart disease Father        pacemaker   Leukemia Daughter    Allergies Son    Rheum arthritis Sister      Past Surgical History:  Procedure Laterality Date   AUGMENTATION MAMMAPLASTY Bilateral 2012   BREAST CYST ASPIRATION  12/12/2012   BREAST SURGERY     rt bx   CARPAL TUNNEL RELEASE Right 08/18/2018   Procedure: Right limited open carpal tunnel release;  Surgeon: Roseanne Kaufman, MD;  Location: [place];  Service: Orthopedics;  Laterality: Right;  Local with IV Sed   CYSTOSCOPY W/ RETROGRADES Left 07/24/2014   Procedure: cYSTOSCOPY WITH RETROGRADE PYELOGRAM dilation left ureter basket extraction left ureteral stone left double j stent;  Surgeon: Ailene Rud, MD;  [facility];  Service: Urology;  Laterality: Left;   CYSTOSCOPY WITH STENT PLACEMENT Left 07/24/2014   Procedure: CYSTOSCOPY WITH STENT PLACEMENT;  Surgeon: Ailene Rud, MD;  [facility];  Service: Urology;  Laterality: Left;   FINGER ARTHRODESIS Right 02/06/2013   Procedure: ARTHRODESIS FINGER;  Surgeon: Cammie Sickle., MD;  Location: [place];  Service: Orthopedics;  Laterality: Right;  Right Index Metacarpal phalangeal jOint Arthrodesis   OPEN REDUCTION INTERNAL FIXATION (ORIF) DISTAL RADIAL FRACTURE Right 04/12/2018  Procedure: OPEN REDUCTION INTERNAL FIXATION (ORIF) DISTAL RADIAL FRACTURE;  Surgeon: Roseanne Kaufman, MD;  Location: [place];  Service: Orthopedics;  Laterality: Right;   septorhinoplasty  1988   SHOULDER ARTHROSCOPY WITH ROTATOR CUFF REPAIR Right 06/08/2018   Procedure: SHOULDER ARTHROSCOPY WITH ROTATOR CUFF REPAIR [employer] AND SUBCAPSULARIS REPAIR;  Surgeon: Tania Ade, MD;  Location: [place];  Service: Orthopedics;  Laterality: Right;   SHOULDER SURGERY     bilateral x5   TOTAL ABDOMINAL HYSTERECTOMY  1999    Social History   Socioeconomic  History   Marital status: Divorced    Spouse name: Not on file   Number of children: Y   Years of education: Not on file   Highest education level: Not on file  Occupational History   Occupation: OR/Surgical Engineer, production: [employer]  Tobacco Use   Smoking status: Former    Packs/day: 0.20    Years: 15.00    Total pack years: 3.00    Types: Cigarettes    Quit date: 09/20/2005    Years since quitting: 16.4   Smokeless tobacco: Never  Vaping Use   Vaping Use: Never used  Substance and Sexual Activity   Alcohol use: Yes    Alcohol/week: 0.0 standard drinks of alcohol    Comment: OCC   Drug use: No   Sexual activity: Yes    Comment: 1st intercourse- 56,  partners- greater than 5  Other Topics Concern   Not on file  Social History Narrative   Not on file   Social Determinants of Health   Financial Resource Strain: Not on file  Food Insecurity: Not on file  Transportation Needs: Not on file  Physical Activity: Not on file  Stress: Not on file  Social Connections: Not on file  Intimate Partner Violence: Not on file     Allergies  Allergen Reactions   Other Anaphylaxis    FOOD ALLERGY: 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      Outpatient Medications Prior to Visit  Medication Sig Dispense Refill   ezetimibe (ZETIA) 10 MG tablet Take 1 tablet by mouth daily.     ondansetron (ZOFRAN-ODT) 4 MG disintegrating tablet Take 1 tablet (4 mg total) by mouth every 8 (eight) hours as needed for nausea  or vomiting. 20 tablet 0   traMADol (ULTRAM) 50 MG tablet Take 50 mg by mouth every 6 (six) hours as needed (for pain.).      UNABLE TO FIND Med Name: oxycarbazepine 300 mg three times daily     No facility-administered medications prior to visit.       Objective:   Physical Exam:  General appearance: 61 y.o., female, NAD, conversant  Eyes: anicteric sclerae; PERRL, tracking appropriately HENT: NCAT; MMM Neck: Trachea midline; no lymphadenopathy, no JVD Lungs: CTAB, no crackles, no wheeze, with normal respiratory effort CV: RRR, no murmur  Abdomen: Soft, non-tender; non-distended, BS present  Extremities: No peripheral edema, warm Skin: Normal turgor and texture; no rash Psych: Appropriate affect Neuro: Alert and  oriented to person and place, no focal deficit     Vitals:   03/05/22 0843  BP: 120/62  Pulse: 68  Temp: 98.1 F (36.7 C)  TempSrc: Oral  SpO2: 98%  Weight: 123 lb 12.8 oz (56.2 kg)  Height: 5' (1.524 m)    98% on RA BMI Readings from Last 3 Encounters:  03/05/22 24.18 kg/m  11/11/21 24.37 kg/m  08/18/18 24.56 kg/m   Wt Readings from Last 3 Encounters:  03/05/22 123 lb 12.8 oz (56.2 kg)  11/11/21 129 lb (58.5 kg)  08/18/18 130 lb (59 kg)     CBC    Component Value Date/Time   WBC 6.8 08/23/2021 1110   RBC 4.47 08/23/2021 1110   HGB 13.2 08/23/2021 1110   HCT 39.6 08/23/2021 1110   PLT 201 08/23/2021 1110   MCV 88.6 08/23/2021 1110   MCH 29.5 08/23/2021 1110   MCHC 33.3 08/23/2021 1110   RDW 12.7 08/23/2021 1110   LYMPHSABS 0.9 08/23/2021 1110   MONOABS 0.4 08/23/2021 1110   EOSABS 0.1 08/23/2021 1110   BASOSABS 0.0 08/23/2021 1110   Eos 100-400  Chest Imaging: CT A/P lung bases reviewed by me unremarkable  CXR 2016 reviewed by me unremarkable  CXR 11/11/21 reviewed by me unremarkable  Pulmonary Functions Testing Results:     No data to display            Assessment & Plan:   #Chronic cough: Has had extensive  unrevealing workup. Seen in past by ENT, pulm, PCP. Spirometry historically not suggestive of asthma, didn't respond to 8 week trial of ppi. Tried gabapentin but she got lost driving in [place] when she took it. Tramadol per pt and Dr. Gwenette Greet has worked in past.   Plan: - She asks to have PCP at [facility] place referral to ENT for flexible nasolaryngoscopic exam to evaluate for VCD. Ideally when she has symptoms of cough, throat tightness, hoarseness in November. Would also refer to voice/speech for cough.  Suspect functional cough issue, VCD possible but lower suspicion   Maryjane Hurter, MD [facility] 03/05/2022 9:15 AM

## 2022-03-05 ENCOUNTER — Encounter: Payer: Self-pay | Admitting: Student

## 2022-03-05 ENCOUNTER — Ambulatory Visit (INDEPENDENT_AMBULATORY_CARE_PROVIDER_SITE_OTHER): Payer: No Typology Code available for payment source | Admitting: Student

## 2022-03-05 VITALS — BP 120/62 | HR 68 | Temp 98.1°F | Ht 60.0 in | Wt 123.8 lb

## 2022-03-05 DIAGNOSIS — R053 Chronic cough: Secondary | ICD-10-CM | POA: Diagnosis not present

## 2022-03-05 NOTE — Patient Instructions (Signed)
-   Please place referral to ENT for flexible nasolaryngoscopic exam to evaluate for VCD. Ideally when she has symptoms of cough, throat tightness, hoarseness in November. Would also refer to voice/speech for cough.

## 2022-04-07 DIAGNOSIS — F4312 Post-traumatic stress disorder, chronic: Secondary | ICD-10-CM | POA: Diagnosis not present

## 2022-05-19 DIAGNOSIS — F4312 Post-traumatic stress disorder, chronic: Secondary | ICD-10-CM | POA: Diagnosis not present

## 2022-06-16 DIAGNOSIS — F4312 Post-traumatic stress disorder, chronic: Secondary | ICD-10-CM | POA: Diagnosis not present

## 2022-07-09 ENCOUNTER — Other Ambulatory Visit: Payer: Self-pay | Admitting: Geriatric Medicine

## 2022-07-09 DIAGNOSIS — Z1231 Encounter for screening mammogram for malignant neoplasm of breast: Secondary | ICD-10-CM

## 2022-07-20 DIAGNOSIS — F4312 Post-traumatic stress disorder, chronic: Secondary | ICD-10-CM | POA: Diagnosis not present

## 2022-09-01 ENCOUNTER — Ambulatory Visit
Admission: RE | Admit: 2022-09-01 | Discharge: 2022-09-01 | Disposition: A | Discharge status: Home / Self Care | Payer: PPO | Source: Ambulatory Visit | Attending: Geriatric Medicine | Admitting: Geriatric Medicine

## 2022-09-01 DIAGNOSIS — Z1231 Encounter for screening mammogram for malignant neoplasm of breast: Secondary | ICD-10-CM | POA: Diagnosis not present

## 2022-10-11 DIAGNOSIS — F4312 Post-traumatic stress disorder, chronic: Secondary | ICD-10-CM | POA: Diagnosis not present

## 2022-12-06 DIAGNOSIS — F4312 Post-traumatic stress disorder, chronic: Secondary | ICD-10-CM | POA: Diagnosis not present

## 2022-12-10 ENCOUNTER — Telehealth: Payer: Self-pay

## 2022-12-10 NOTE — Patient Instructions (Signed)
Visit Information  Thank you for taking time to visit with me today. Please don't hesitate to contact me if I can be of assistance to you.   Following are the goals we discussed today:   Goals Addressed             This Visit's Progress    COMPLETED: Care Coordination Activities- No Follow Up Required       Interventions Today    Flowsheet Row Most Recent Value  General Interventions   General Interventions Discussed/Reviewed General Interventions Discussed, Doctor Visits  Doctor Visits Discussed/Reviewed Doctor Visits Discussed, Annual Wellness Visits, PCP  [Discussed calling to schedule an appt with new PCP]  Education Interventions   Education Provided Provided Education  Provided Verbal Education On Other  [care coordination services]               If you are experiencing a Mental Health or [place] or need someone to talk to, please call the Suicide and Crisis Lifeline: 988 call the [place] National Suicide Prevention Lifeline: (540) 252-8792 or TTY: (339) 601-7703 [place] [phone]) to talk to a trained counselor call 1-800-273-TALK (toll free, 24 hour hotline) go to [facility] [place] ([phone]) call 911   Patient verbalizes understanding of instructions and care plan provided today and agrees to view in [place]. Active MyChart status and patient understanding of how to access instructions and care plan via MyChart confirmed with patient.     No further follow up required:    [place], Jackquline Denmark, [place] [facility] [phone]

## 2022-12-10 NOTE — Patient Outreach (Signed)
  Care Coordination   Initial Visit Note   12/10/2022 Name: Dana Sanders MRN: [id number] DOB: 12-Jan-1961  Dana Sanders is a 62 y.o. year old female who sees Lajean Manes, MD for primary care. I spoke with  Dana Sanders by phone today.  What matters to the patients health and wellness today?  No concerns today States she gets most of her care at the [facility]             This Visit's Progress    COMPLETED: Care Coordination Activities- No Follow Up Required       Interventions Today    Flowsheet Row Most Recent Value  General Interventions   General Interventions Discussed/Reviewed General Interventions Discussed, Doctor Visits  Doctor Visits Discussed/Reviewed Doctor Visits Discussed, Annual Wellness Visits, PCP  [Discussed calling to schedule an appt with new PCP]  Education Interventions   Education Provided Provided Education  Provided Verbal Education On Other  [care coordination services]              SDOH assessments and interventions completed:  Yes  SDOH Interventions Today    Flowsheet Row Most Recent Value  SDOH Interventions   Food Insecurity Interventions Intervention Not Indicated  Housing Interventions Intervention Not Indicated  Transportation Interventions Intervention Not Indicated  Utilities Interventions Intervention Not Indicated        Care Coordination Interventions:  Yes, provided   Follow up plan: No further intervention required.   Encounter Outcome:  Pt. Visit Completed \] Peter Garter RN, BSN,CCM, CDE Care Management Coordinator [facility] [phone]

## 2023-01-20 DIAGNOSIS — F4312 Post-traumatic stress disorder, chronic: Secondary | ICD-10-CM | POA: Diagnosis not present

## 2023-03-28 DIAGNOSIS — F4312 Post-traumatic stress disorder, chronic: Secondary | ICD-10-CM | POA: Diagnosis not present

## 2023-05-05 DIAGNOSIS — F4312 Post-traumatic stress disorder, chronic: Secondary | ICD-10-CM | POA: Diagnosis not present

## 2023-06-16 DIAGNOSIS — F4312 Post-traumatic stress disorder, chronic: Secondary | ICD-10-CM | POA: Diagnosis not present

## 2023-07-13 DIAGNOSIS — F4312 Post-traumatic stress disorder, chronic: Secondary | ICD-10-CM | POA: Diagnosis not present

## 2023-07-21 ENCOUNTER — Other Ambulatory Visit: Payer: Self-pay | Admitting: Internal Medicine

## 2023-07-21 DIAGNOSIS — Z1231 Encounter for screening mammogram for malignant neoplasm of breast: Secondary | ICD-10-CM

## 2023-07-21 DIAGNOSIS — N6320 Unspecified lump in the left breast, unspecified quadrant: Secondary | ICD-10-CM

## 2023-07-27 ENCOUNTER — Ambulatory Visit
Admission: RE | Admit: 2023-07-27 | Discharge: 2023-07-27 | Disposition: A | Discharge status: Home / Self Care | Payer: Medicare HMO | Source: Ambulatory Visit | Attending: Internal Medicine | Admitting: Internal Medicine

## 2023-07-27 ENCOUNTER — Other Ambulatory Visit: Payer: Self-pay | Admitting: Internal Medicine

## 2023-07-27 DIAGNOSIS — N6002 Solitary cyst of left breast: Secondary | ICD-10-CM | POA: Diagnosis not present

## 2023-07-27 DIAGNOSIS — N6320 Unspecified lump in the left breast, unspecified quadrant: Secondary | ICD-10-CM

## 2023-07-27 DIAGNOSIS — Z1231 Encounter for screening mammogram for malignant neoplasm of breast: Secondary | ICD-10-CM

## 2023-09-07 ENCOUNTER — Ambulatory Visit
Admission: RE | Admit: 2023-09-07 | Discharge: 2023-09-07 | Disposition: A | Discharge status: Home / Self Care | Payer: No Typology Code available for payment source | Source: Ambulatory Visit | Attending: Internal Medicine | Admitting: Internal Medicine

## 2023-09-07 DIAGNOSIS — Z1231 Encounter for screening mammogram for malignant neoplasm of breast: Secondary | ICD-10-CM

## 2024-08-15 NOTE — Progress Notes (Signed)
 Sent message, via epic in basket, requesting orders in epic from Careers adviser.

## 2024-08-21 ENCOUNTER — Other Ambulatory Visit: Payer: Self-pay | Admitting: Internal Medicine

## 2024-08-21 DIAGNOSIS — Z1231 Encounter for screening mammogram for malignant neoplasm of breast: Secondary | ICD-10-CM

## 2024-08-21 NOTE — Progress Notes (Signed)
 Sent message, via epic in basket, requesting orders in epic from Careers adviser.

## 2024-08-21 NOTE — Patient Instructions (Addendum)
 SURGICAL WAITING ROOM VISITATION  Patients having surgery or a procedure may have no more than 2 support people in the waiting area - these visitors may rotate.    Children under the age of 3 must have an adult with them who is not the patient.  Visitors with respiratory illnesses are discouraged from visiting and should remain at home.  If the patient needs to stay at the hospital during part of their recovery, the visitor guidelines for inpatient rooms apply. Pre-op nurse will coordinate an appropriate time for 1 support person to accompany patient in pre-op.  This support person may not rotate.    Please refer to the [facility] website for the visitor guidelines for Inpatients (after your surgery is over and you are in a regular room).       Your procedure is scheduled on: 09/04/24   Report to [facility] Main Entrance    Report to admitting at 1:15 PM   Call this number if you have problems the morning of surgery [phone]   Do not eat food or drink liquids:After Midnight. But may have sips of water  to take meds.   Oral Hygiene is also important to reduce your risk of infection.                                    Remember - BRUSH YOUR TEETH THE MORNING OF SURGERY WITH YOUR REGULAR TOOTHPASTE    Stop all vitamins and herbal supplements 7 days before surgery.   Take these medicines the morning of surgery with A SIP OF WATER : colestipol(colestid), ezetimibe(zetia)             You may not have any metal on your body including hair pins, jewelry, and body piercing             Do not wear make-up, lotions, powders, perfumes/cologne, or deodorant  Do not wear nail polish including gel and S&S, artificial/acrylic nails, or any other type of covering on natural nails including finger and toenails. If you have artificial nails, gel coating, etc. that needs to be removed by a nail salon please have this removed prior to surgery or surgery may need to be canceled/ delayed if  the surgeon/ anesthesia feels like they are unable to be safely monitored.   Do not shave  48 hours prior to surgery.           Do not bring valuables to the hospital. [facility] IS NOT             RESPONSIBLE   FOR VALUABLES.   Contacts, glasses, dentures or bridgework may not be worn into surgery.   Bring small overnight bag day of surgery.   DO NOT BRING YOUR HOME MEDICATIONS TO THE HOSPITAL. PHARMACY WILL DISPENSE MEDICATIONS LISTED ON YOUR MEDICATION LIST TO YOU DURING YOUR ADMISSION IN THE HOSPITAL!    Patients discharged on the day of surgery will not be allowed to drive home.  Someone NEEDS to stay with you for the first 24 hours after anesthesia.   Special Instructions: Bring a copy of your healthcare power of attorney and living will documents the day of surgery if you haven't scanned them before.              Please read over the following fact sheets you were given: IF YOU HAVE QUESTIONS ABOUT YOUR PRE-OP INSTRUCTIONS PLEASE CALL [phone] Dana Sanders  If you received a COVID test during your pre-op visit  it is requested that you wear a mask when out in public, stay away from anyone that may not be feeling well and notify your surgeon if you develop symptoms. If you test positive for Covid or have been in contact with anyone that has tested positive in the last 10 days please notify you surgeon.      Pre-operative 4 CHG Bath Instructions  DYNA-Hex 4 Chlorhexidine  Gluconate 4% Solution Antiseptic 4 fl. oz   You can play a key role in reducing the risk of infection after surgery. Your skin needs to be as free of germs as possible. You can reduce the number of germs on your skin by washing with CHG (chlorhexidine  gluconate) soap before surgery. CHG is an antiseptic soap that kills germs and continues to kill germs even after washing.   DO NOT use if you have an allergy to chlorhexidine /CHG or antibacterial soaps. If your skin becomes reddened or irritated, stop using the CHG  and notify one of our RNs at   Please shower with the CHG soap starting 4 days before surgery using the following schedule:     Please keep in mind the following:  DO NOT shave, including legs and underarms, starting the day of your first shower.   You may shave your face at any point before/day of surgery.  Place clean sheets on your bed the day you start using CHG soap. Use a clean washcloth (not used since being washed) for each shower. DO NOT sleep with pets once you start using the CHG.  CHG Shower Instructions:  If you choose to wash your hair and private area, wash first with your normal shampoo/soap.  After you use shampoo/soap, rinse your hair and body thoroughly to remove shampoo/soap residue.  Turn the water  OFF and apply about 3 tablespoons (45 ml) of CHG soap to a CLEAN washcloth.  Apply CHG soap ONLY FROM YOUR NECK DOWN TO YOUR TOES (washing for 3-5 minutes)  DO NOT use CHG soap on face, private areas, open wounds, or sores.  Pay special attention to the area where your surgery is being performed.  If you are having back surgery, having someone wash your back for you may be helpful. Wait 2 minutes after CHG soap is applied, then you may rinse off the CHG soap.  Pat dry with a clean towel  Put on clean clothes/pajamas   If you choose to wear lotion, please use ONLY the CHG-compatible lotions on the back of this paper.     Additional instructions for the day of surgery: DO NOT APPLY any lotions, deodorants, cologne, or perfumes.   Put on clean/comfortable clothes.  Brush your teeth.  Ask your nurse before applying any prescription medications to the skin.   CHG Compatible Lotions   Aveeno Moisturizing lotion  Cetaphil Moisturizing Cream  Cetaphil Moisturizing Lotion  Clairol Herbal Essence Moisturizing Lotion, Dry Skin  Clairol Herbal Essence Moisturizing Lotion, Extra Dry Skin  Clairol Herbal Essence Moisturizing Lotion, Normal Skin  Curel Age Defying Therapeutic  Moisturizing Lotion with Alpha Hydroxy  Curel Extreme Care Body Lotion  Curel Soothing Hands Moisturizing Hand Lotion  Curel Therapeutic Moisturizing Cream, Fragrance-Free  Curel Therapeutic Moisturizing Lotion, Fragrance-Free  Curel Therapeutic Moisturizing Lotion, Original Formula  Eucerin Daily Replenishing Lotion  Eucerin Dry Skin Therapy Plus Alpha Hydroxy Crme  Eucerin Dry Skin Therapy Plus Alpha Hydroxy Lotion  Eucerin Original Crme  Eucerin Original Lotion  Eucerin  Plus Crme Eucerin Plus Lotion  Eucerin TriLipid Replenishing Lotion  Keri Anti-Bacterial Hand Lotion  Keri Deep Conditioning Original Lotion Dry Skin Formula Softly Scented  Keri Deep Conditioning Original Lotion, Fragrance Free Sensitive Skin Formula  Keri Lotion Fast Absorbing Fragrance Free Sensitive Skin Formula  Keri Lotion Fast Absorbing Softly Scented Dry Skin Formula  Keri Original Lotion  Keri Skin Renewal Lotion Keri Silky Smooth Lotion  Keri Silky Smooth Sensitive Skin Lotion  Nivea Body Creamy Conditioning Oil  Nivea Body Extra Enriched Teacher, Adult Education Moisturizing Lotion Nivea Crme  Nivea Skin Firming Lotion  NutraDerm 30 Skin Lotion  NutraDerm Skin Lotion  NutraDerm Therapeutic Skin Cream  NutraDerm Therapeutic Skin Lotion  ProShield Protective Hand Cream  WHAT IS A BLOOD TRANSFUSION? Blood Transfusion Information  A transfusion is the replacement of blood or some of its parts. Blood is made up of multiple cells which provide different functions. Red blood cells carry oxygen and are used for blood loss replacement. White blood cells fight against infection. Platelets control bleeding. Plasma helps clot blood. Other blood products are available for specialized needs, such as hemophilia or other clotting disorders. BEFORE THE TRANSFUSION  Who gives blood for transfusions?  Healthy volunteers who are fully evaluated to make sure their blood is safe. This  is blood bank blood. Transfusion therapy is the safest it has ever been in the practice of medicine. Before blood is taken from a donor, a complete history is taken to make sure that person has no history of diseases nor engages in risky social behavior (examples are intravenous drug use or sexual activity with multiple partners). The donor's travel history is screened to minimize risk of transmitting infections, such as malaria. The donated blood is tested for signs of infectious diseases, such as HIV and hepatitis. The blood is then tested to be sure it is compatible with you in order to minimize the chance of a transfusion reaction. If you or a relative donates blood, this is often done in anticipation of surgery and is not appropriate for emergency situations. It takes many days to process the donated blood. RISKS AND COMPLICATIONS Although transfusion therapy is very safe and saves many lives, the main dangers of transfusion include:  Getting an infectious disease. Developing a transfusion reaction. This is an allergic reaction to something in the blood you were given. Every precaution is taken to prevent this. The decision to have a blood transfusion has been considered carefully by your caregiver before blood is given. Blood is not given unless the benefits outweigh the risks. AFTER THE TRANSFUSION Right after receiving a blood transfusion, you will usually feel much better and more energetic. This is especially true if your red blood cells have gotten low (anemic). The transfusion raises the level of the red blood cells which carry oxygen, and this usually causes an energy increase. The nurse administering the transfusion will monitor you carefully for complications. HOME CARE INSTRUCTIONS  No special instructions are needed after a transfusion. You may find your energy is better. Speak with your caregiver about any limitations on activity for underlying diseases you may have. SEEK MEDICAL CARE  IF:  Your condition is not improving after your transfusion. You develop redness or irritation at the intravenous (IV) site. SEEK IMMEDIATE MEDICAL CARE IF:  Any of the following symptoms occur over the next 12 hours: Shaking chills. You have a temperature by mouth above 102 F (38.9 C), not controlled by medicine.  Chest, back, or muscle pain. People around you feel you are not acting correctly or are confused. Shortness of breath or difficulty breathing. Dizziness and fainting. You get a rash or develop hives. You have a decrease in urine output. Your urine turns a dark color or changes to pink, red, or brown. Any of the following symptoms occur over the next 10 days: You have a temperature by mouth above 102 F (38.9 C), not controlled by medicine. Shortness of breath. Weakness after normal activity. The white part of the eye turns yellow (jaundice). You have a decrease in the amount of urine or are urinating less often. Your urine turns a dark color or changes to pink, red, or brown. Document Released: 09/03/2000 Document Revised: 11/29/2011 Document Reviewed: 04/22/2008 [facility] Patient Information 2014 [place], [place].

## 2024-08-21 NOTE — Progress Notes (Signed)
 COVID Vaccine received:  [x]  No []  Yes Date of any COVID positive Test in last 90 days: no PCP - [facility] -Allean Irving MD Cardiologist - n/a  Chest x-ray -  EKG -   Stress Test -  ECHO -  Cardiac Cath -   Bowel Prep - [x]  No  []   Yes ______  Pacemaker / ICD device [x]  No []  Yes   Spinal Cord Stimulator:[x]  No []  Yes       History of Sleep Apnea? [x]  No []  Yes   CPAP used?- [x]  No []  Yes    Does the patient monitor blood sugar?          [x]  No []  Yes  []  N/A  Patient has: [x]  NO Hx DM   []  Pre-DM                 []  DM1  []   DM2 Does patient have a [employer] or Dexacom? []  No []  Yes   Fasting Blood Sugar Ranges-  Checks Blood Sugar _____ times a day  GLP1 agonist / usual dose - no GLP1 instructions:  SGLT-2 inhibitors / usual dose - no SGLT-2 instructions:   Blood Thinner / Instructions:no Aspirin Instructions:no  Comments:   Activity level: Patient is able to climb a flight of stairs without difficulty; []  No CP  []  No SOB,  Patient can perform ADLs without assistance.   Anesthesia review:   Patient denies shortness of breath, fever, cough and chest pain at PAT appointment.  Patient verbalized understanding and agreement to the Pre-Surgical Instructions that were given to them at this PAT appointment. Patient was also educated of the need to review these PAT instructions again prior to his/her surgery.I reviewed the appropriate phone numbers to call if they have any and questions or concerns.

## 2024-08-21 NOTE — Progress Notes (Signed)
 Request sent to Dr. EMERSON Car to send pre op orders for PST visit 08/23/24.

## 2024-08-23 ENCOUNTER — Encounter (HOSPITAL_COMMUNITY)
Admission: RE | Admit: 2024-08-23 | Discharge: 2024-08-23 | Disposition: A | Source: Ambulatory Visit | Attending: Orthopedic Surgery

## 2024-08-23 ENCOUNTER — Encounter (HOSPITAL_COMMUNITY): Payer: Self-pay

## 2024-08-23 ENCOUNTER — Other Ambulatory Visit: Payer: Self-pay

## 2024-08-23 VITALS — BP 105/78 | HR 79 | Temp 98.2°F | Resp 16 | Ht 60.0 in | Wt 126.0 lb

## 2024-08-23 DIAGNOSIS — Z01818 Encounter for other preprocedural examination: Secondary | ICD-10-CM

## 2024-08-23 HISTORY — DX: Unspecified osteoarthritis, unspecified site: M19.90

## 2024-08-23 HISTORY — DX: Myoneural disorder, unspecified: G70.9

## 2024-08-23 LAB — CBC
HCT: 38.7 % (ref 36.0–46.0)
Hemoglobin: 13.4 g/dL (ref 12.0–15.0)
MCH: 30.2 pg (ref 26.0–34.0)
MCHC: 34.6 g/dL (ref 30.0–36.0)
MCV: 87.4 fL (ref 80.0–100.0)
Platelets: 218 K/uL (ref 150–400)
RBC: 4.43 MIL/uL (ref 3.87–5.11)
RDW: 12.7 % (ref 11.5–15.5)
WBC: 4.8 K/uL (ref 4.0–10.5)
nRBC: 0 % (ref 0.0–0.2)

## 2024-08-23 LAB — SURGICAL PCR SCREEN
MRSA, PCR: NEGATIVE
Staphylococcus aureus: NEGATIVE
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
## 2024-09-03 NOTE — Anesthesia Preprocedure Evaluation (Signed)
 Anesthesia Evaluation  Patient identified by MRN, date of birth, ID band Patient awake    Reviewed: Allergy & Precautions, NPO status , Patient's Chart, lab work & pertinent test results  History of Anesthesia Complications (+) PONV and history of anesthetic complications  Airway Mallampati: II  TM Distance: >3 FB Neck ROM: Full    Dental  (+) Dental Advisory Given   Pulmonary former smoker Quit smoking 2007   Pulmonary exam normal breath sounds clear to auscultation       Cardiovascular negative cardio ROS Normal cardiovascular exam Rhythm:Regular Rate:Normal     Neuro/Psych  Headaches PSYCHIATRIC DISORDERS Anxiety Depression    Currently has migraine in preop- does not know what abortive medication she takes at home    GI/Hepatic negative GI ROS, Neg liver ROS,,,  Endo/Other  negative endocrine ROS    Renal/GU negative Renal ROS  negative genitourinary   Musculoskeletal  (+) Arthritis , Osteoarthritis,    Abdominal   Peds  Hematology negative hematology ROS (+) Hb 13.4, plt 218   Anesthesia Other Findings   Reproductive/Obstetrics negative OB ROS                              Anesthesia Physical Anesthesia Plan  ASA: 2  Anesthesia Plan: MAC and Spinal   Post-op Pain Management: Tylenol  PO (pre-op)*   Induction:   PONV Risk Score and Plan: 2 and Propofol  infusion and TIVA  Airway Management Planned: Natural Airway and Simple Face Mask  Additional Equipment: None  Intra-op Plan:   Post-operative Plan:   Informed Consent: I have reviewed the patients History and Physical, chart, labs and discussed the procedure including the risks, benefits and alternatives for the proposed anesthesia with the patient or authorized representative who has indicated his/her understanding and acceptance.       Plan Discussed with: CRNA  Anesthesia Plan Comments:           Anesthesia Quick Evaluation

## 2024-09-04 ENCOUNTER — Ambulatory Visit (HOSPITAL_COMMUNITY)

## 2024-09-04 ENCOUNTER — Encounter (HOSPITAL_COMMUNITY): Payer: Self-pay | Admitting: Orthopedic Surgery

## 2024-09-04 ENCOUNTER — Ambulatory Visit (HOSPITAL_BASED_OUTPATIENT_CLINIC_OR_DEPARTMENT_OTHER): Admitting: Anesthesiology

## 2024-09-04 ENCOUNTER — Other Ambulatory Visit: Payer: Self-pay

## 2024-09-04 ENCOUNTER — Encounter (HOSPITAL_COMMUNITY): Payer: Self-pay | Admitting: Medical

## 2024-09-04 ENCOUNTER — Encounter (HOSPITAL_COMMUNITY): Admission: RE | Disposition: A | Payer: Self-pay | Source: Ambulatory Visit | Attending: Orthopedic Surgery

## 2024-09-04 ENCOUNTER — Observation Stay (HOSPITAL_COMMUNITY)
Admission: RE | Admit: 2024-09-04 | Discharge: 2024-09-05 | Disposition: A | Discharge status: Home / Self Care | Source: Ambulatory Visit | Attending: Orthopedic Surgery | Admitting: Orthopedic Surgery

## 2024-09-04 DIAGNOSIS — Z87891 Personal history of nicotine dependence: Secondary | ICD-10-CM | POA: Insufficient documentation

## 2024-09-04 DIAGNOSIS — Z96641 Presence of right artificial hip joint: Secondary | ICD-10-CM

## 2024-09-04 DIAGNOSIS — M1611 Unilateral primary osteoarthritis, right hip: Principal | ICD-10-CM

## 2024-09-04 HISTORY — PX: TOTAL HIP ARTHROPLASTY: SHX124

## 2024-09-04 LAB — BASIC METABOLIC PANEL WITH GFR
Anion gap: 9 (ref 5–15)
BUN: 16 mg/dL (ref 8–23)
CO2: 27 mmol/L (ref 22–32)
Calcium: 9.8 mg/dL (ref 8.9–10.3)
Chloride: 103 mmol/L (ref 98–111)
Creatinine, Ser: 0.8 mg/dL (ref 0.44–1.00)
GFR, Estimated: >60 mL/min (ref >60)
Glucose, Bld: 90 mg/dL (ref 70–99)
Potassium: 4 mmol/L (ref 3.5–5.1)
Sodium: 140 mmol/L (ref 135–145)

## 2024-09-04 LAB — TYPE AND SCREEN
ABO/RH(D): B POS
ABO/RH(D): B POS
Antibody Screen: NEGATIVE
Antibody Screen: NEGATIVE

## 2024-09-04 SURGERY — ARTHROPLASTY, HIP, TOTAL, ANTERIOR APPROACH
Anesthesia: Monitor Anesthesia Care | Site: Hip | Laterality: Right

## 2024-09-04 MED ORDER — 0.9 % SODIUM CHLORIDE (POUR BTL) OPTIME
TOPICAL | Status: DC | PRN
  Administered 2024-09-04: 15:00:00 1000 mL

## 2024-09-04 MED ORDER — POVIDONE-IODINE 10 % EX SWAB: 2.0000 | Freq: Once | CUTANEOUS | Status: DC

## 2024-09-04 MED ORDER — POLYETHYLENE GLYCOL 3350 17 G PO PACK
17.0000 g | PACK | Freq: Two times a day (BID) | ORAL | Status: DC
  Administered 2024-09-04: 22:00:00 17 g via ORAL
  Filled 2024-09-04: qty 1

## 2024-09-04 MED ORDER — ONDANSETRON HCL 4 MG/2ML IJ SOLN: 4.0000 mg | Freq: Once | INTRAMUSCULAR | Status: DC | PRN

## 2024-09-04 MED ORDER — METOCLOPRAMIDE HCL 5 MG/ML IJ SOLN: 5.0000 mg | Freq: Three times a day (TID) | INTRAMUSCULAR | Status: DC | PRN

## 2024-09-04 MED ORDER — ONDANSETRON HCL 4 MG/2ML IJ SOLN
INTRAMUSCULAR | Status: DC | PRN
  Administered 2024-09-04: 15:00:00 4 mg via INTRAVENOUS

## 2024-09-04 MED ORDER — MIDAZOLAM HCL 2 MG/2ML IJ SOLN
INTRAMUSCULAR | Status: AC
  Filled 2024-09-04: qty 2

## 2024-09-04 MED ORDER — PROCHLORPERAZINE EDISYLATE 10 MG/2ML IJ SOLN
10.0000 mg | Freq: Once | INTRAMUSCULAR | Status: AC
  Administered 2024-09-04: 14:00:00 10 mg via INTRAVENOUS

## 2024-09-04 MED ORDER — KETOROLAC TROMETHAMINE 30 MG/ML IJ SOLN
30.0000 mg | Freq: Once | INTRAMUSCULAR | Status: AC | PRN
  Administered 2024-09-04: 18:00:00 30 mg via INTRAVENOUS

## 2024-09-04 MED ORDER — PHENOL 1.4 % MT LIQD: 1.0000 | OROMUCOSAL | Status: DC | PRN

## 2024-09-04 MED ORDER — PHENYLEPHRINE 80 MCG/ML (10ML) SYRINGE FOR IV PUSH (FOR BLOOD PRESSURE SUPPORT)
PREFILLED_SYRINGE | INTRAVENOUS | Status: DC | PRN
  Administered 2024-09-04 (×2): 160 ug via INTRAVENOUS
  Administered 2024-09-04 (×2): 80 ug via INTRAVENOUS

## 2024-09-04 MED ORDER — BUPIVACAINE-EPINEPHRINE (PF) 0.25% -1:200000 IJ SOLN
INTRAMUSCULAR | Status: AC
  Filled 2024-09-04: qty 30

## 2024-09-04 MED ORDER — COLESTIPOL HCL 1 G PO TABS
2.0000 g | ORAL_TABLET | Freq: Two times a day (BID) | ORAL | Status: DC
  Administered 2024-09-04: 22:00:00 2 g via ORAL
  Filled 2024-09-04: qty 2

## 2024-09-04 MED ORDER — TRAMADOL HCL 50 MG PO TABS
50.0000 mg | ORAL_TABLET | Freq: Four times a day (QID) | ORAL | Status: DC | PRN
  Administered 2024-09-04: 22:00:00 50 mg via ORAL
  Filled 2024-09-04: qty 2

## 2024-09-04 MED ORDER — MIDAZOLAM HCL 5 MG/5ML IJ SOLN
INTRAMUSCULAR | Status: DC | PRN
  Administered 2024-09-04: 15:00:00 2 mg via INTRAVENOUS

## 2024-09-04 MED ORDER — PROCHLORPERAZINE EDISYLATE 10 MG/2ML IJ SOLN
INTRAMUSCULAR | Status: AC
  Filled 2024-09-04: qty 2

## 2024-09-04 MED ORDER — ONDANSETRON HCL 4 MG PO TABS: 4.0000 mg | ORAL_TABLET | Freq: Four times a day (QID) | ORAL | Status: DC | PRN

## 2024-09-04 MED ORDER — DIPHENHYDRAMINE HCL 12.5 MG/5ML PO ELIX: 12.5000 mg | ORAL_SOLUTION | ORAL | Status: DC | PRN

## 2024-09-04 MED ORDER — CHLORHEXIDINE GLUCONATE 0.12 % MT SOLN
15.0000 mL | Freq: Once | OROMUCOSAL | Status: AC
  Administered 2024-09-04: 12:00:00 15 mL via OROMUCOSAL

## 2024-09-04 MED ORDER — METOCLOPRAMIDE HCL 5 MG PO TABS: 5.0000 mg | ORAL_TABLET | Freq: Three times a day (TID) | ORAL | Status: DC | PRN

## 2024-09-04 MED ORDER — PROPOFOL 500 MG/50ML IV EMUL
INTRAVENOUS | Status: DC | PRN
  Administered 2024-09-04: 15:00:00 80 ug/kg/min via INTRAVENOUS
  Administered 2024-09-04: 15:00:00 30 mg via INTRAVENOUS

## 2024-09-04 MED ORDER — DEXAMETHASONE SOD PHOSPHATE PF 10 MG/ML IJ SOLN: 10.0000 mg | Freq: Once | INTRAMUSCULAR | Status: DC

## 2024-09-04 MED ORDER — SODIUM CHLORIDE 0.9 % IV SOLN: INTRAVENOUS | Status: DC

## 2024-09-04 MED ORDER — PROPOFOL 10 MG/ML IV BOLUS
INTRAVENOUS | Status: AC
  Filled 2024-09-04: qty 20

## 2024-09-04 MED ORDER — ALUM & MAG HYDROXIDE-SIMETH 200-200-20 MG/5ML PO SUSP: 30.0000 mL | ORAL | Status: DC | PRN

## 2024-09-04 MED ORDER — KETOROLAC TROMETHAMINE 15 MG/ML IJ SOLN
7.5000 mg | Freq: Four times a day (QID) | INTRAMUSCULAR | Status: DC
  Administered 2024-09-04: 20:00:00 7.5 mg via INTRAVENOUS
  Filled 2024-09-04: qty 1

## 2024-09-04 MED ORDER — MEPERIDINE HCL 25 MG/ML IJ SOLN: 6.2500 mg | INTRAMUSCULAR | Status: DC | PRN

## 2024-09-04 MED ORDER — KETOROLAC TROMETHAMINE 15 MG/ML IJ SOLN
7.5000 mg | Freq: Four times a day (QID) | INTRAMUSCULAR | Status: DC
  Administered 2024-09-05: 05:00:00 7.5 mg via INTRAVENOUS
  Filled 2024-09-04: qty 1

## 2024-09-04 MED ORDER — HYDROMORPHONE HCL 1 MG/ML IJ SOLN
0.2500 mg | INTRAMUSCULAR | Status: DC | PRN
  Administered 2024-09-04 (×4): 0.5 mg via INTRAVENOUS

## 2024-09-04 MED ORDER — SENNA 8.6 MG PO TABS
2.0000 | ORAL_TABLET | Freq: Every day | ORAL | Status: DC
  Administered 2024-09-04: 22:00:00 17.2 mg via ORAL
  Filled 2024-09-04: qty 2

## 2024-09-04 MED ORDER — DEXAMETHASONE SOD PHOSPHATE PF 10 MG/ML IJ SOLN
8.0000 mg | Freq: Once | INTRAMUSCULAR | Status: AC
  Administered 2024-09-04: 15:00:00 8 mg via INTRAVENOUS

## 2024-09-04 MED ORDER — HYDROMORPHONE HCL 1 MG/ML IJ SOLN
INTRAMUSCULAR | Status: AC
  Filled 2024-09-04: qty 1

## 2024-09-04 MED ORDER — ACETAMINOPHEN 500 MG PO TABS
1000.0000 mg | ORAL_TABLET | Freq: Once | ORAL | Status: AC
  Administered 2024-09-04: 12:00:00 1000 mg via ORAL
  Filled 2024-09-04: qty 2

## 2024-09-04 MED ORDER — PHENYLEPHRINE HCL-NACL 20-0.9 MG/250ML-% IV SOLN
INTRAVENOUS | Status: DC | PRN
  Administered 2024-09-04: 15:00:00 50 ug/min via INTRAVENOUS

## 2024-09-04 MED ORDER — CEFAZOLIN SODIUM-DEXTROSE 2-4 GM/100ML-% IV SOLN
2.0000 g | Freq: Four times a day (QID) | INTRAVENOUS | Status: AC
  Administered 2024-09-04 – 2024-09-05 (×2): 2 g via INTRAVENOUS
  Filled 2024-09-04 (×2): qty 100

## 2024-09-04 MED ORDER — SODIUM CHLORIDE (PF) 0.9 % IJ SOLN
INTRAMUSCULAR | Status: DC | PRN
  Administered 2024-09-04: 15:00:00 61 mL

## 2024-09-04 MED ORDER — ONDANSETRON HCL 4 MG/2ML IJ SOLN
INTRAMUSCULAR | Status: AC
  Filled 2024-09-04: qty 2

## 2024-09-04 MED ORDER — TRANEXAMIC ACID-NACL 1000-0.7 MG/100ML-% IV SOLN
1000.0000 mg | Freq: Once | INTRAVENOUS | Status: AC
  Administered 2024-09-04: 20:00:00 1000 mg via INTRAVENOUS
  Filled 2024-09-04: qty 100

## 2024-09-04 MED ORDER — STERILE WATER FOR IRRIGATION IR SOLN
Status: DC | PRN
  Administered 2024-09-04: 15:00:00 2000 mL

## 2024-09-04 MED ORDER — ACETAMINOPHEN 500 MG PO TABS
1000.0000 mg | ORAL_TABLET | Freq: Four times a day (QID) | ORAL | Status: DC
  Administered 2024-09-04: 20:00:00 1000 mg via ORAL
  Filled 2024-09-04: qty 2

## 2024-09-04 MED ORDER — ACETAMINOPHEN 500 MG PO TABS
1000.0000 mg | ORAL_TABLET | Freq: Four times a day (QID) | ORAL | Status: DC
  Administered 2024-09-05: 05:00:00 1000 mg via ORAL
  Filled 2024-09-04: qty 2

## 2024-09-04 MED ORDER — ASPIRIN 81 MG PO CHEW
81.0000 mg | CHEWABLE_TABLET | Freq: Two times a day (BID) | ORAL | Status: DC
  Administered 2024-09-04 – 2024-09-05 (×2): 81 mg via ORAL
  Filled 2024-09-04 (×2): qty 1

## 2024-09-04 MED ORDER — SODIUM CHLORIDE (PF) 0.9 % IJ SOLN
INTRAMUSCULAR | Status: AC
  Filled 2024-09-04: qty 50

## 2024-09-04 MED ORDER — KETOROLAC TROMETHAMINE 30 MG/ML IJ SOLN
INTRAMUSCULAR | Status: AC
  Filled 2024-09-04: qty 1

## 2024-09-04 MED ORDER — OXYCODONE HCL 5 MG PO TABS: 5.0000 mg | ORAL_TABLET | Freq: Once | ORAL | Status: DC | PRN

## 2024-09-04 MED ORDER — MEPIVACAINE HCL (PF) 2 % IJ SOLN
INTRAMUSCULAR | Status: DC | PRN
  Administered 2024-09-04: 15:00:00 3 mL via INTRATHECAL

## 2024-09-04 MED ORDER — TRANEXAMIC ACID-NACL 1000-0.7 MG/100ML-% IV SOLN
1000.0000 mg | INTRAVENOUS | Status: AC
  Administered 2024-09-04: 15:00:00 1000 mg via INTRAVENOUS
  Filled 2024-09-04: qty 100

## 2024-09-04 MED ORDER — PHENYLEPHRINE 80 MCG/ML (10ML) SYRINGE FOR IV PUSH (FOR BLOOD PRESSURE SUPPORT)
PREFILLED_SYRINGE | INTRAVENOUS | Status: AC
  Filled 2024-09-04: qty 10

## 2024-09-04 MED ORDER — LACTATED RINGERS IV SOLN: INTRAVENOUS | Status: DC

## 2024-09-04 MED ORDER — CYCLOBENZAPRINE HCL 5 MG PO TABS
5.0000 mg | ORAL_TABLET | Freq: Three times a day (TID) | ORAL | Status: DC | PRN
  Administered 2024-09-04: 22:00:00 5 mg via ORAL
  Filled 2024-09-04: qty 2

## 2024-09-04 MED ORDER — HYDROMORPHONE HCL 1 MG/ML IJ SOLN: 0.5000 mg | INTRAMUSCULAR | Status: DC | PRN

## 2024-09-04 MED ORDER — FENTANYL CITRATE (PF) 100 MCG/2ML IJ SOLN
INTRAMUSCULAR | Status: AC
  Filled 2024-09-04: qty 2

## 2024-09-04 MED ORDER — GLYCOPYRROLATE 0.2 MG/ML IJ SOLN
INTRAMUSCULAR | Status: DC | PRN
  Administered 2024-09-04: 15:00:00 .2 mg via INTRAVENOUS

## 2024-09-04 MED ORDER — CEFAZOLIN SODIUM-DEXTROSE 2-4 GM/100ML-% IV SOLN
2.0000 g | INTRAVENOUS | Status: AC
  Administered 2024-09-04: 15:00:00 2 g via INTRAVENOUS
  Filled 2024-09-04: qty 100

## 2024-09-04 MED ORDER — AMISULPRIDE (ANTIEMETIC) 5 MG/2ML IV SOLN: 10.0000 mg | Freq: Once | INTRAVENOUS | Status: DC | PRN

## 2024-09-04 MED ORDER — EZETIMIBE 10 MG PO TABS: 10.0000 mg | ORAL_TABLET | Freq: Every day | ORAL | Status: DC

## 2024-09-04 MED ORDER — BISACODYL 10 MG RE SUPP: 10.0000 mg | Freq: Every day | RECTAL | Status: DC | PRN

## 2024-09-04 MED ORDER — ONDANSETRON HCL 4 MG/2ML IJ SOLN: 4.0000 mg | Freq: Four times a day (QID) | INTRAMUSCULAR | Status: DC | PRN

## 2024-09-04 MED ORDER — PROPOFOL 1000 MG/100ML IV EMUL
INTRAVENOUS | Status: AC
  Filled 2024-09-04: qty 100

## 2024-09-04 MED ORDER — ORAL CARE MOUTH RINSE: 15.0000 mL | Freq: Once | OROMUCOSAL | Status: AC

## 2024-09-04 MED ORDER — OXYCODONE HCL 5 MG/5ML PO SOLN: 5.0000 mg | Freq: Once | ORAL | Status: DC | PRN

## 2024-09-04 MED ORDER — MENTHOL 3 MG MT LOZG: 1.0000 | LOZENGE | OROMUCOSAL | Status: DC | PRN

## 2024-09-04 SURGICAL SUPPLY — 37 items
BAG COUNTER SPONGE SURGICOUNT (BAG) IMPLANT
BAG ZIPLOCK 12X15 (MISCELLANEOUS) ×1 IMPLANT
BLADE SAG 18X100X1.27 (BLADE) ×1 IMPLANT
COVER PERINEAL POST (MISCELLANEOUS) ×1 IMPLANT
COVER SURGICAL LIGHT HANDLE (MISCELLANEOUS) ×1 IMPLANT
CUP ACETBLR 52 OD PINNACLE (Hips) IMPLANT
DERMABOND ADVANCED .7 DNX12 (GAUZE/BANDAGES/DRESSINGS) ×1 IMPLANT
DRAPE FOOT SWITCH (DRAPES) ×1 IMPLANT
DRAPE STERI IOBAN 125X83 (DRAPES) ×1 IMPLANT
DRAPE U-SHAPE 47X51 STRL (DRAPES) ×2 IMPLANT
DRESSING AQUACEL AG SP 3.5X10 (GAUZE/BANDAGES/DRESSINGS) ×1 IMPLANT
DURAPREP 26ML APPLICATOR (WOUND CARE) ×1 IMPLANT
ELECT REM PT RETURN 15FT ADLT (MISCELLANEOUS) ×1 IMPLANT
GLOVE BIO SURGEON STRL SZ 6 (GLOVE) ×1 IMPLANT
GLOVE BIOGEL PI IND STRL 6.5 (GLOVE) ×1 IMPLANT
GLOVE BIOGEL PI IND STRL 7.5 (GLOVE) ×1 IMPLANT
GLOVE ORTHO TXT STRL SZ7.5 (GLOVE) ×2 IMPLANT
GOWN STRL REUS W/ TWL LRG LVL3 (GOWN DISPOSABLE) ×2 IMPLANT
HEAD CERAMIC 36 PLUS5 (Hips) IMPLANT
HOLDER FOLEY CATH W/STRAP (MISCELLANEOUS) ×1 IMPLANT
KIT TURNOVER KIT A (KITS) ×1 IMPLANT
LINER NEUTRAL 52X36MM PLUS 4 (Liner) IMPLANT
MANIFOLD NEPTUNE II (INSTRUMENTS) ×1 IMPLANT
NDL SAFETY ECLIPSE 18X1.5 (NEEDLE) ×1 IMPLANT
PACK ANTERIOR HIP CUSTOM (KITS) ×1 IMPLANT
PENCIL SMOKE EVACUATOR (MISCELLANEOUS) ×1 IMPLANT
SCREW 6.5MMX25MM (Screw) IMPLANT
STEM FEM SZ3 STD ACTIS (Stem) IMPLANT
SUT MNCRL AB 4-0 PS2 18 (SUTURE) ×1 IMPLANT
SUT VIC AB 1 CT1 36 (SUTURE) ×3 IMPLANT
SUT VIC AB 2-0 CT1 TAPERPNT 27 (SUTURE) ×2 IMPLANT
SUTURE STRATFX 0 PDS 27 VIOLET (SUTURE) ×1 IMPLANT
SYR 3ML LL SCALE MARK (SYRINGE) ×1 IMPLANT
TOWEL GREEN STERILE FF (TOWEL DISPOSABLE) ×1 IMPLANT
TRAY FOLEY MTR SLVR 16FR STAT (SET/KITS/TRAYS/PACK) ×1 IMPLANT
TUBE SUCTION HIGH CAP CLEAR NV (SUCTIONS) ×1 IMPLANT
WATER STERILE IRR 1000ML POUR (IV SOLUTION) ×1 IMPLANT

## 2024-09-04 NOTE — Discharge Instructions (Signed)

## 2024-09-04 NOTE — Transfer of Care (Signed)
 Immediate Anesthesia Transfer of Care Note  Patient: Dana Sanders  Procedure(s) Performed: ARTHROPLASTY, HIP, TOTAL, ANTERIOR APPROACH (Right: Hip)  Patient Location: PACU  Anesthesia Type:MAC and Spinal  Level of Consciousness: awake, alert , oriented, and patient cooperative  Airway & Oxygen Therapy: Patient Spontanous Breathing and Patient connected to face mask oxygen  Post-op Assessment: Report given to RN and Post -op Vital signs reviewed and stable  Post vital signs: Reviewed and stable  Last Vitals:  Vitals Value Taken Time  BP 85/72 09/04/24 16:24  Temp    Pulse 93 09/04/24 16:25  Resp 17 09/04/24 16:25  SpO2 97 % 09/04/24 16:25  Vitals shown include unfiled device data.  Last Pain:  Vitals:   09/04/24 1223  TempSrc: Oral         Complications: No notable events documented.

## 2024-09-04 NOTE — Interval H&P Note (Signed)
 History and Physical Interval Note:  09/04/2024 12:21 PM  Dana Sanders  has presented today for surgery, with the diagnosis of Right hip osteoarthritis.  The various methods of treatment have been discussed with the patient and family. After consideration of risks, benefits and other options for treatment, the patient has consented to  Procedures: ARTHROPLASTY, HIP, TOTAL, ANTERIOR APPROACH (Right) as a surgical intervention.  The patient's history has been reviewed, patient examined, no change in status, stable for surgery.  I have reviewed the patient's chart and labs.  Questions were answered to the patient's satisfaction.     [name]

## 2024-09-04 NOTE — Anesthesia Procedure Notes (Signed)
 Spinal  Patient location during procedure: OR Start time: 09/04/2024 2:51 PM End time: 09/04/2024 2:54 PM Reason for block: surgical anesthesia  Staffing Performed: anesthesiologist  Authorized by: Merla Almarie HERO, DO   Performed by: Merla Almarie HERO, DO  Preanesthetic Checklist Completed: patient identified, IV checked, risks and benefits discussed, surgical consent, monitors and equipment checked, pre-op evaluation and timeout performed Spinal Block Patient position: sitting Prep: DuraPrep and site prepped and draped Patient monitoring: cardiac monitor, continuous pulse ox and blood pressure Approach: midline Location: L3-4 Injection technique: single-shot Needle Needle type: Pencan  Needle gauge: 24 G Needle length: 9 cm Assessment Sensory level: T6 Events: CSF return  Additional Notes Functioning IV was confirmed and monitors were applied. Sterile prep and drape, including hand hygiene and sterile gloves were used. The patient was positioned and the spine was prepped. The skin was anesthetized with lidocaine .  Free flow of clear CSF was obtained prior to injecting local anesthetic into the CSF.  The spinal needle aspirated freely following injection.  The needle was carefully withdrawn.  The patient tolerated the procedure well.

## 2024-09-04 NOTE — Anesthesia Postprocedure Evaluation (Signed)
 Anesthesia Post Note  Patient: Dana Sanders  Procedure(s) Performed: ARTHROPLASTY, HIP, TOTAL, ANTERIOR APPROACH (Right: Hip)     Patient location during evaluation: PACU Anesthesia Type: MAC and Spinal Level of consciousness: awake and alert and oriented Pain management: pain level controlled Vital Signs Assessment: post-procedure vital signs reviewed and stable Respiratory status: spontaneous breathing, nonlabored ventilation and respiratory function stable Cardiovascular status: blood pressure returned to baseline and stable Postop Assessment: no headache, no backache, spinal receding and no apparent nausea or vomiting Anesthetic complications: no   No notable events documented.  Last Vitals:  Vitals:   09/04/24 1826 09/04/24 1830  BP:  97/83  Pulse: (!) 59 83  Resp: 13 15  Temp:    SpO2: 97% 100%    Last Pain:  Vitals:   09/04/24 1826  TempSrc:   PainSc: 6                  Almarie CHRISTELLA Marchi

## 2024-09-04 NOTE — Op Note (Addendum)
 NAME:  Dana Sanders                ACCOUNT NO.: [id number]      MEDICAL RECORD NO.: [id number]      FACILITY:  [facility]      PHYSICIAN:  [name]  DATE OF BIRTH:  October 24, 1960     DATE OF PROCEDURE:  09/04/2024                                 OPERATIVE REPORT         PREOPERATIVE DIAGNOSIS: Right  hip osteoarthritis.      POSTOPERATIVE DIAGNOSIS:  Right hip osteoarthritis.      PROCEDURE:  Right total hip replacement through an anterior approach   utilizing DePuy THR system, component size 52 mm pinnacle cup, a size 36+4 neutral   Altrex liner, a size 3 standard Actis stem with a 36+5 delta ceramic   ball.      SURGEON:  Donnice JONETTA. Car, M.D.      ASSISTANT:  Rosina Calin, PA-C     ANESTHESIA:  Spinal.      SPECIMENS:  None.      COMPLICATIONS:  None.      BLOOD LOSS:  500 cc     DRAINS:  None.      INDICATION OF THE PROCEDURE:  Dana Sanders is a 63 y.o. female who had   presented to office for evaluation of right hip pain.  Radiographs revealed   progressive degenerative changes with bone-on-bone   articulation of the  hip joint, including subchondral cystic changes and osteophytes.  The patient had painful limited range of   motion significantly affecting their overall quality of life and function.  The patient was failing to    respond to conservative measures including medications and/or injections and activity modification and at this point was ready   to proceed with more definitive measures.  Consent was obtained for   benefit of pain relief.  Specific risks of infection, DVT, component   failure, dislocation, neurovascular injury, and need for revision surgery were reviewed in the office.     PROCEDURE IN DETAIL:  The patient was brought to operative theater.   Once adequate anesthesia, preoperative antibiotics, 2 gm of Ancef , 1 gm of Tranexamic Acid , and 10 mg of Decadron  were administered, the patient was positioned  supine on the [employer] table.  Once the patient was safely positioned with adequate padding of boney prominences we predraped out the hip, and used fluoroscopy to confirm orientation of the pelvis.      The right hip was then prepped and draped from proximal iliac crest to   mid thigh with a shower curtain technique.      Time-out was performed identifying the patient, planned procedure, and the appropriate extremity.     An incision was then made 2 cm lateral to the   anterior superior iliac spine extending over the orientation of the   tensor fascia lata muscle and sharp dissection was carried down to the   fascia of the muscle.      The fascia was then incised.  The muscle belly was identified and swept   laterally and retractor placed along the superior neck.  Following   cauterization of the circumflex vessels and removing some pericapsular   fat, a second cobra retractor was placed on the inferior  neck.  A T-capsulotomy was made along the line of the   superior neck to the trochanteric fossa, then extended proximally and   distally.  Tag sutures were placed and the retractors were then placed   intracapsular.  We then identified the trochanteric fossa and   orientation of my neck cut and then made a neck osteotomy with the femur on traction.  The femoral   head was removed without difficulty or complication.  Traction was let   off and retractors were placed posterior and anterior around the   acetabulum.      The labrum and foveal tissue were debrided.  I began reaming with a 46 mm   reamer and reamed up to 51 mm reamer with good bony bed preparation and a 52 mm  cup was chosen.  The final 52 mm Pinnacle cup was then impacted under fluoroscopy to confirm the depth of penetration and orientation with respect to   Abduction and forward flexion.  A screw was placed into the ilium followed by the hole eliminator.  The final   36+4 neutral Altrex liner was impacted with good visualized  rim fit.  The cup was positioned anatomically within the acetabular portion of the pelvis.      At this point, the femur was rolled to 100 degrees.  Further capsule was   released off the inferior aspect of the femoral neck.  I then   released the superior capsule proximally.  With the leg in a neutral position the hook was placed laterally   along the femur under the vastus lateralis origin and elevated manually and then held in position using the hook attachment on the bed.  The leg was then extended and adducted with the leg rolled to 100   degrees of external rotation.  Retractors were placed along the medial calcar and posteriorly over the greater trochanter.  Once the proximal femur was fully   exposed, I used a box osteotome to set orientation.  I then began   broaching with the starting chili pepper broach and passed this by hand and then broached up to 3.  With the 3 broach in place I chose a standard offset neck and did several trial reductions.  The offset was appropriate, leg lengths   appeared to be equal best matched with the +5 head ball trial confirmed radiographically.   Given these findings, I went ahead and dislocated the hip, repositioned all   retractors and positioned the right hip in the extended and abducted position.  The final 3 standard Actis stem was   chosen and it was impacted down to the level of neck cut.  Based on this   and the trial reductions, a final 36+5 delta ceramic ball was chosen and   impacted onto a clean and dry trunnion, and the hip was reduced.  The   hip had been irrigated throughout the case again at this point.  I did   reapproximate the superior capsular leaflet to the anterior leaflet   using #1 Vicryl.  The fascia of the   tensor fascia lata muscle was then reapproximated using #1 Vicryl and #0 Stratafix sutures.  The   remaining wound was closed with 2-0 Vicryl and running 4-0 Monocryl.   The hip was cleaned, dried, and dressed sterilely  using Dermabond and   Aquacel dressing.  The patient was then brought   to recovery room in stable condition tolerating the procedure well.    Rosina  Patti, PA-C was present for the entirety of the case involved from   preoperative positioning, perioperative retractor management, general   facilitation of the case, as well as primary wound closure as assistant.            Donnice CORDOBA Ernie, M.D.        09/04/2024 12:21 PM

## 2024-09-04 NOTE — Anesthesia Procedure Notes (Signed)
 Procedure Name: MAC Date/Time: 09/04/2024 2:54 PM  Performed by: Nada Corean CROME, CRNAPre-anesthesia Checklist: Patient identified, Emergency Drugs available, Suction available, Patient being monitored and Timeout performed Patient Re-evaluated:Patient Re-evaluated prior to induction Oxygen Delivery Method: Simple face mask Preoxygenation: Pre-oxygenation with 100% oxygen Induction Type: IV induction Placement Confirmation: positive ETCO2 Dental Injury: Teeth and Oropharynx as per pre-operative assessment

## 2024-09-05 DIAGNOSIS — M1611 Unilateral primary osteoarthritis, right hip: Secondary | ICD-10-CM | POA: Diagnosis not present

## 2024-09-05 LAB — BASIC METABOLIC PANEL WITH GFR
Anion gap: 5 (ref 5–15)
BUN: 17 mg/dL (ref 8–23)
CO2: 28 mmol/L (ref 22–32)
Calcium: 8.9 mg/dL (ref 8.9–10.3)
Chloride: 107 mmol/L (ref 98–111)
Creatinine, Ser: 0.79 mg/dL (ref 0.44–1.00)
GFR, Estimated: >60 mL/min (ref >60)
Glucose, Bld: 130 mg/dL — ABNORMAL HIGH (ref 70–99)
Potassium: 4.3 mmol/L (ref 3.5–5.1)
Sodium: 140 mmol/L (ref 135–145)

## 2024-09-05 LAB — CBC
HCT: 29.1 % — ABNORMAL LOW (ref 36.0–46.0)
Hemoglobin: 9.8 g/dL — ABNORMAL LOW (ref 12.0–15.0)
MCH: 29.7 pg (ref 26.0–34.0)
MCHC: 33.7 g/dL (ref 30.0–36.0)
MCV: 88.2 fL (ref 80.0–100.0)
Platelets: 187 K/uL (ref 150–400)
RBC: 3.3 MIL/uL — ABNORMAL LOW (ref 3.87–5.11)
RDW: 12.4 % (ref 11.5–15.5)
WBC: 8.9 K/uL (ref 4.0–10.5)
nRBC: 0 % (ref 0.0–0.2)

## 2024-09-05 MED ORDER — ASPIRIN 81 MG PO CHEW: 81.0000 mg | CHEWABLE_TABLET | Freq: Two times a day (BID) | ORAL | Status: AC

## 2024-09-05 NOTE — Progress Notes (Signed)
 Patient has DME at home, [place] already sent and she picked up her prescriptions, all questions answered regarding discharge. Patient ride on the way.

## 2024-09-05 NOTE — Evaluation (Signed)
 Physical Therapy Evaluation Patient Details Name: Dana Sanders MRN: [id number] DOB: Sep 28, 1960 Today's Date: 09/05/2024  History of Present Illness  Pt is a 63 y/o F admitted on 09/04/24 for scheduled R THA. PMH: anxiety, depression, B shoulder sx, Lichen sclerosus  Clinical Impression  Pt seen for PT evaluation with pt agreeable, received standing in room. Pt reports prior to admission she was living alone, independent without AD. Provided pt with HEP handout & reviewed seated & standing RLE exercises with pt reporting understanding, comfort with exercises. Pt is able to ambulate around unit with RW & mod I, negotiated 4 steps with B rails & mod I. Pt is mobilizing very well & eager to d/c home soon, nurse made aware.       If plan is discharge home, recommend the following: Assistance with cooking/housework;Assist for transportation   Can travel by private vehicle        Equipment Recommendations None recommended by PT (pt has RW & [facility])  Recommendations for Other Services       Functional Status Assessment Patient has had a recent decline in their functional status and demonstrates the ability to make significant improvements in function in a reasonable and predictable amount of time.     Precautions / Restrictions Precautions Precautions: None Restrictions Weight Bearing Restrictions Per Provider Order: Yes RLE Weight Bearing Per Provider Order: Weight bearing as tolerated      Mobility  Bed Mobility               General bed mobility comments: not tested    Transfers Overall transfer level: Modified independent Equipment used: None               General transfer comment: sit>stand from recliner    Ambulation/Gait Ambulation/Gait assistance: Modified independent (Device/Increase time) Gait Distance (Feet):  (>300 ft) Assistive device: Rolling walker (2 wheels)   Gait velocity: decreased     General Gait Details: cuing to relax  shoulders  Stairs Stairs: Yes Stairs assistance: Modified independent (Device/Increase time) Stair Management: Two rails, Step to pattern Number of Stairs: 4 (6) General stair comments: education re: compensatory pattern  Wheelchair Mobility     Tilt Bed    Modified Rankin (Stroke Patients Only)       Balance Overall balance assessment: Needs assistance   Sitting balance-Leahy Scale: Good     Standing balance support: Bilateral upper extremity supported, Single extremity supported Standing balance-Leahy Scale: Good                               Pertinent Vitals/Pain Pain Assessment Pain Assessment: 0-10 Pain Score: 3  Pain Location: R hip with certain movements Pain Descriptors / Indicators: Discomfort Pain Intervention(s): Monitored during session    Home Living Family/patient expects to be discharged to:: Private residence Living Arrangements: Alone Available Help at Discharge: Friend(s);Family;Available PRN/intermittently Type of Home: House Home Access: Level entry (at garage entrance)       Home Layout: Two level;Able to live on main level with bedroom/bathroom Home Equipment: Rolling Walker (2 wheels);Cane - single point      Prior Function Prior Level of Function : Independent/Modified Independent;Driving             Mobility Comments: Ambulatory without AD, driving, reports hx of falls 2/2 medication but has since stopped that med.       Extremity/Trunk Assessment   Upper Extremity Assessment Upper Extremity Assessment: Overall [facility]  for tasks assessed    Lower Extremity Assessment Lower Extremity Assessment: Overall WFL for tasks assessed;RLE deficits/detail RLE Deficits / Details: 3-/5 knee extension in sitting       Communication   Communication Communication: No apparent difficulties    Cognition Arousal: Alert Behavior During Therapy: WFL for tasks assessed/performed   PT - Cognitive impairments: No apparent  impairments                         Following commands: Intact       Cueing Cueing Techniques: Verbal cues     General Comments General comments (skin integrity, edema, etc.): Pt briefly trialed SPC vs QC (~3 ft each) as she was inquiring about using QC but reports she feels more comfortable with SPC she has at home.    Exercises Other Exercises Other Exercises: Provided pt with HEP handout & pt quickly ran through seated LAQ, standing: hip flexion, knee flexion, hip extension & hip abduction with RLE. Pt reports comfort wtih exercises.   Assessment/Plan    PT Assessment Patient needs continued PT services  PT Problem List Decreased strength;Pain;Decreased range of motion;Decreased activity tolerance;Decreased balance;Decreased mobility;Decreased knowledge of use of DME       PT Treatment Interventions Therapeutic exercise;DME instruction;Stair training;Neuromuscular re-education;Gait training;Balance training;Functional mobility training;Therapeutic activities;Patient/family education    PT Goals (Current goals can be found in the Care Plan section)  Acute Rehab PT Goals Patient Stated Goal: go home PT Goal Formulation: With patient Time For Goal Achievement: 09/19/24 Potential to Achieve Goals: Good    Frequency 7X/week     Co-evaluation               AM-PAC PT 6 Clicks Mobility  Outcome Measure Help needed turning from your back to your side while in a flat bed without using bedrails?: None Help needed moving from lying on your back to sitting on the side of a flat bed without using bedrails?: A Little Help needed moving to and from a bed to a chair (including a wheelchair)?: None Help needed standing up from a chair using your arms (e.g., wheelchair or bedside chair)?: None Help needed to walk in hospital room?: None Help needed climbing 3-5 steps with a railing? : None 6 Click Score: 23    End of Session   Activity Tolerance: Patient tolerated  treatment well Patient left:  (standing in room, nurse made aware) Nurse Communication: Mobility status PT Visit Diagnosis: Other abnormalities of gait and mobility (R26.89);Muscle weakness (generalized) (M62.81)    Time: 9267-9251 PT Time Calculation (min) (ACUTE ONLY): 16 min   Charges:   PT Evaluation $PT Eval Low Complexity: 1 Low   PT General Charges $$ ACUTE PT VISIT: 1 Visit         Richerd Pinal, PT, DPT 09/05/2024, 7:58 AM   Richerd CHRISTELLA Pinal 09/05/2024, 7:57 AM

## 2024-09-05 NOTE — Plan of Care (Signed)
   Problem: Education: Goal: Knowledge of General Education information will improve Description Including pain rating scale, medication(s)/side effects and non-pharmacologic comfort measures Outcome: Progressing

## 2024-09-05 NOTE — Progress Notes (Signed)
° °  Subjective: 1 Day Post-Op Procedures (LRB): ARTHROPLASTY, HIP, TOTAL, ANTERIOR APPROACH (Right) Patient reports pain as mild.   Patient seen in rounds for Dr. Ernie. Patient is resting in bed on exam this morning. No acute events overnight. Foley catheter removed. Patient did not get up with PT yesterday.  We will start therapy today.   Objective: Vital signs in last 24 hours: Temp:  [97.5 F (36.4 C)-98.8 F (37.1 C)] 98 F (36.7 C) (12/17 0647) Pulse Rate:  [59-94] 85 (12/17 0647) Resp:  [10-21] 19 (12/17 0647) BP: (78-166)/(55-133) 101/55 (12/17 0647) SpO2:  [93 %-100 %] 100 % (12/17 0647) Weight:  [57.2 kg] 57.2 kg (12/16 1204)  Intake/Output from previous day:  Intake/Output Summary (Last 24 hours) at 09/05/2024 0824 Last data filed at 09/05/2024 0600 Gross per 24 hour  Intake 2235.05 ml  Output 850 ml  Net 1385.05 ml     Intake/Output this shift: No intake/output data recorded.  Labs: Recent Labs    09/05/24 0341  HGB 9.8*   Recent Labs    09/05/24 0341  WBC 8.9  RBC 3.30*  HCT 29.1*  PLT 187   Recent Labs    09/04/24 1222 09/05/24 0341  NA 140 140  K 4.0 4.3  CL 103 107  CO2 27 28  BUN 16 17  CREATININE 0.80 0.79  GLUCOSE 90 130*  CALCIUM 9.8 8.9   No results for input(s): LABPT, INR in the last 72 hours.  Exam: General - Patient is Alert and Oriented Extremity - Neurologically intact Sensation intact distally Intact pulses distally Dorsiflexion/Plantar flexion intact Dressing - dressing C/D/I Motor Function - intact, moving foot and toes well on exam.   Past Medical History:  Diagnosis Date   Anxiety    Arthritis    Depression    Headache    migraines in the past   History of kidney stones    Lichen sclerosus    Low iron    Neuromuscular disorder (HCC)    Pneumonia    PONV (postoperative nausea and vomiting)     Assessment/Plan: 1 Day Post-Op Procedures (LRB): ARTHROPLASTY, HIP, TOTAL, ANTERIOR APPROACH  (Right) Principal Problem:   S/P total right hip arthroplasty  Estimated body mass index is 24.61 kg/m as calculated from the following:   Height as of this encounter: 5' (1.524 m).   Weight as of this encounter: 57.2 kg. Advance diet Up with therapy D/C IV fluids  DVT Prophylaxis - Aspirin  Weight bearing as tolerated.  Hgb stable at 9.8 this AM.  Patient has all meds at home  Plan is to go Home after hospital stay. Plan for discharge today after meeting goals with therapy. Follow up in the office in 2 weeks.   Rosina Calin, PA-C Orthopedic Surgery [phone] 09/05/2024, 8:24 AM

## 2024-09-05 NOTE — TOC Transition Note (Signed)
 Transition of Care [facility]) - Discharge Note   Patient Details  Name: DEMA TIMMONS MRN: [id number] Date of Birth: Nov 10, 1960  [facility] CM/SW Contact:  Alfonse JONELLE Rex, RN Phone Number: 09/05/2024, 9:58 AM   Clinical Narrative:   Met with patient at bedside to review dc therapy and home equipment needs, patient confirmed HEP, has RW. No INPT CM needs.     Final next level of care: Home/Self Care Barriers to Discharge: No Barriers Identified   Patient Goals and CMS Choice Patient states their goals for this hospitalization and ongoing recovery are:: return home          Discharge Placement                       Discharge Plan and Services Additional resources added to the After Visit Summary for                                       Social Drivers of Health (SDOH) Interventions SDOH Screenings   Food Insecurity: No Food Insecurity (09/05/2024)  Housing: Low Risk (09/05/2024)  Transportation Needs: No Transportation Needs (09/05/2024)  Utilities: Not At Risk (09/05/2024)  Tobacco Use: Medium Risk (09/04/2024)     Readmission Risk Interventions     No data to display

## 2024-09-06 ENCOUNTER — Encounter (HOSPITAL_COMMUNITY): Payer: Self-pay | Admitting: Orthopedic Surgery

## 2024-09-11 NOTE — Discharge Summary (Signed)
 Patient ID: Dana Sanders MRN: [id number] DOB/AGE: 1961-06-01 63 y.o.  Admit date: 09/04/2024 Discharge date: 09/05/2024  Admission Diagnoses:  Right hip osteoarthritis  Discharge Diagnoses:  Principal Problem:   S/P total right hip arthroplasty   Past Medical History:  Diagnosis Date   Anxiety    Arthritis    Depression    Headache    migraines in the past   History of kidney stones    Lichen sclerosus    Low iron    Neuromuscular disorder (HCC)    Pneumonia    PONV (postoperative nausea and vomiting)     Surgeries: Procedures: ARTHROPLASTY, HIP, TOTAL, ANTERIOR APPROACH on 09/04/2024   Consultants:   Discharged Condition: Improved  Hospital Course: Dana Sanders is an 63 y.o. female who was admitted 09/04/2024 for operative treatment ofS/P total right hip arthroplasty. Patient has severe unremitting pain that affects sleep, daily activities, and work/hobbies. After pre-op clearance the patient was taken to the operating room on 09/04/2024 and underwent  Procedures: ARTHROPLASTY, HIP, TOTAL, ANTERIOR APPROACH.    Patient was given perioperative antibiotics:  Anti-infectives (From admission, onward)    Start     Dose/Rate Route Frequency Ordered Stop   09/04/24 2100  ceFAZolin  (ANCEF ) IVPB 2g/100 mL premix        2 g 200 mL/hr over 30 Minutes Intravenous Every 6 hours 09/04/24 1846 09/05/24 0550   09/04/24 1215  ceFAZolin  (ANCEF ) IVPB 2g/100 mL premix        2 g 200 mL/hr over 30 Minutes Intravenous On call to O.R. 09/04/24 1200 09/04/24 1524        Patient was given sequential compression devices, early ambulation, and chemoprophylaxis to prevent DVT. Patient worked with PT and was meeting their goals regarding safe ambulation and transfers.  Patient benefited maximally from hospital stay and there were no complications.    Recent vital signs: No data found.   Recent laboratory studies: No results for input(s): WBC, HGB, HCT, PLT, NA,  K, CL, CO2, BUN, CREATININE, GLUCOSE, INR, CALCIUM in the last 72 hours.  Invalid input(s): PT, 2   Discharge Medications:   Allergies as of 09/05/2024       Reactions   Other Anaphylaxis   FOOD ALLERGY: Anaphylactic reaction to MELONS   Statins Other (See Comments)   Myalgia   Erythromycin Hives   Penicillins Hives   Has patient had a PCN reaction causing immediate rash, facial/tongue/throat swelling, SOB or lightheadedness with hypotension: Yes Has patient had a PCN reaction causing severe rash involving mucus membranes or skin necrosis: No Has patient had a PCN reaction that required hospitalization: No Has patient had a PCN reaction occurring within the last 10 years: No If all of the above answers are NO, then may proceed with Cephalosporin use. Tolerated Cephalosporin Date: 09/05/2024.        Medication List     STOP taking these medications    diclofenac 75 MG EC tablet Commonly known as: VOLTAREN       TAKE these medications    aspirin  81 MG chewable tablet Chew 1 tablet (81 mg total) by mouth 2 (two) times daily for 28 days.   Biotin 10 MG Tabs Take 1 tablet by mouth daily.   bisacodyl  5 MG EC tablet Commonly known as: DULCOLAX Take 5 mg by mouth daily as needed.   colestipol  1 g tablet Commonly known as: COLESTID  Take 2 g by mouth 2 (two) times daily.   ezetimibe  10 MG tablet  Commonly known as: ZETIA  Take 1 tablet by mouth daily.   traMADol  50 MG tablet Commonly known as: ULTRAM  Take 50 mg by mouth every 6 (six) hours as needed (for pain.).               Discharge Care Instructions  (From admission, onward)           Start     Ordered   09/05/24 0000  Change dressing       Comments: Maintain surgical dressing until follow up in the clinic. If the edges start to pull up, may reinforce with tape. If the dressing is no longer working, may remove and cover with gauze and tape, but must keep the area dry and  clean.  Call with any questions or concerns.   09/05/24 0826            Diagnostic Studies: DG Pelvis Portable Result Date: 09/04/2024 CLINICAL DATA:  Status post right total hip arthroplasty. EXAM: PORTABLE PELVIS 1-2 VIEWS COMPARISON:  Right hip C-arm images obtained earlier today. Abdomen and pelvis CT dated 08/23/2021. FINDINGS: Right total hip prosthesis in satisfactory position and alignment. No fracture or dislocation. IMPRESSION: Satisfactory postoperative appearance of a right total hip prosthesis. Electronically Signed   By: Elspeth Bathe M.D.   On: 09/04/2024 16:54   DG HIP UNILAT WITH PELVIS 1V RIGHT Result Date: 09/04/2024 CLINICAL DATA:  Elective surgery. EXAM: DG HIP (WITH OR WITHOUT PELVIS) 1V RIGHT COMPARISON:  None Available. FINDINGS: Two fluoroscopic spot views of the pelvis and right hip obtained in the operating room. Images during hip arthroplasty. Fluoroscopy time 10 seconds. Dose 1.3 mGy. IMPRESSION: Intraoperative fluoroscopy during right hip arthroplasty. Electronically Signed   By: Andrea Gasman M.D.   On: 09/04/2024 16:26   DG C-Arm 1-60 Min-No Report Result Date: 09/04/2024 Fluoroscopy was utilized by the requesting physician.  No radiographic interpretation.   DG C-Arm 1-60 Min-No Report Result Date: 09/04/2024 Fluoroscopy was utilized by the requesting physician.  No radiographic interpretation.    Disposition: Discharge disposition: 01-Home or Self Care       Discharge Instructions     Call MD / Call 911   Complete by: As directed    If you experience chest pain or shortness of breath, CALL 911 and be transported to the hospital emergency room.  If you develope a fever above 101 F, pus (white drainage) or increased drainage or redness at the wound, or calf pain, call your surgeon's office.   Change dressing   Complete by: As directed    Maintain surgical dressing until follow up in the clinic. If the edges start to pull up, may reinforce with  tape. If the dressing is no longer working, may remove and cover with gauze and tape, but must keep the area dry and clean.  Call with any questions or concerns.   Constipation Prevention   Complete by: As directed    Drink plenty of fluids.  Prune juice may be helpful.  You may use a stool softener, such as Colace (over the counter) 100 mg twice a day.  Use MiraLax  (over the counter) for constipation as needed.   Diet - low sodium heart healthy   Complete by: As directed    Increase activity slowly as tolerated   Complete by: As directed    Weight bearing as tolerated with assist device (walker, cane, etc) as directed, use it as long as suggested by your surgeon or therapist, typically at least 4-6  weeks.   Post-operative opioid taper instructions:   Complete by: As directed    POST-OPERATIVE OPIOID TAPER INSTRUCTIONS: It is important to wean off of your opioid medication as soon as possible. If you do not need pain medication after your surgery it is ok to stop day one. Opioids include: Codeine , Hydrocodone (Norco, Vicodin), Oxycodone (Percocet, oxycontin ) and hydromorphone  amongst others.  Long term and even short term use of opiods can cause: Increased pain response Dependence Constipation Depression Respiratory depression And more.  Withdrawal symptoms can include Flu like symptoms Nausea, vomiting And more Techniques to manage these symptoms Hydrate well Eat regular healthy meals Stay active Use relaxation techniques(deep breathing, meditating, yoga) Do Not substitute Alcohol to help with tapering If you have been on opioids for less than two weeks and do not have pain than it is ok to stop all together.  Plan to wean off of opioids This plan should start within one week post op of your joint replacement. Maintain the same interval or time between taking each dose and first decrease the dose.  Cut the total daily intake of opioids by one tablet each day Next start to increase  the time between doses. The last dose that should be eliminated is the evening dose.      TED hose   Complete by: As directed    Use stockings (TED hose) for 2 weeks on both leg(s).  You may remove them at night for sleeping.        Follow-up Information     Ernie Cough, MD. Schedule an appointment as soon as possible for a visit in 2 week(s).   Specialty: Orthopedic Surgery Contact information: [address] [place] [place] [address] [phone]                  Signed: Rosina JONELLE Calin 09/11/2024, 7:34 AM

## 2024-10-03 ENCOUNTER — Ambulatory Visit

## 2024-10-03 ENCOUNTER — Ambulatory Visit
Admission: RE | Admit: 2024-10-03 | Discharge: 2024-10-03 | Disposition: A | Discharge status: Home / Self Care | Source: Ambulatory Visit | Attending: Internal Medicine | Admitting: Internal Medicine

## 2024-10-03 DIAGNOSIS — Z1231 Encounter for screening mammogram for malignant neoplasm of breast: Secondary | ICD-10-CM
